# Patient Record
Sex: Female | Born: 1940
Health system: Southern US, Community
[De-identification: ages and names within clinical notes are randomized; demographics above are authoritative.]

## PROBLEM LIST (undated history)

## (undated) DIAGNOSIS — R519 Headache, unspecified: Secondary | ICD-10-CM

## (undated) DIAGNOSIS — K219 Gastro-esophageal reflux disease without esophagitis: Secondary | ICD-10-CM

## (undated) DIAGNOSIS — K5909 Other constipation: Secondary | ICD-10-CM

## (undated) DIAGNOSIS — R42 Dizziness and giddiness: Secondary | ICD-10-CM

## (undated) DIAGNOSIS — H269 Unspecified cataract: Secondary | ICD-10-CM

## (undated) DIAGNOSIS — R51 Headache: Secondary | ICD-10-CM

## (undated) DIAGNOSIS — Q438 Other specified congenital malformations of intestine: Secondary | ICD-10-CM

## (undated) HISTORY — DX: Other constipation: K59.09

## (undated) HISTORY — PX: BREAST SURGERY: SHX581

## (undated) HISTORY — PX: TOTAL ABDOMINAL HYSTERECTOMY W/ BILATERAL SALPINGOOPHORECTOMY: SHX83

## (undated) HISTORY — DX: Headache: R51

## (undated) HISTORY — PX: APPENDECTOMY: SHX54

## (undated) HISTORY — DX: Headache, unspecified: R51.9

## (undated) HISTORY — DX: Dizziness and giddiness: R42

## (undated) HISTORY — DX: Unspecified cataract: H26.9

## (undated) HISTORY — DX: Gastro-esophageal reflux disease without esophagitis: K21.9

## (undated) HISTORY — DX: Other specified congenital malformations of intestine: Q43.8

---

## 2006-03-20 HISTORY — PX: ESOPHAGEAL DILATION: SHX303

## 2007-01-07 ENCOUNTER — Encounter: Payer: Self-pay | Admitting: Family Medicine

## 2008-10-27 ENCOUNTER — Ambulatory Visit: Payer: Self-pay | Admitting: Diagnostic Radiology

## 2008-10-27 ENCOUNTER — Ambulatory Visit: Payer: Self-pay | Admitting: Family Medicine

## 2008-10-27 ENCOUNTER — Ambulatory Visit (HOSPITAL_BASED_OUTPATIENT_CLINIC_OR_DEPARTMENT_OTHER): Admission: RE | Admit: 2008-10-27 | Discharge: 2008-10-27 | Payer: Self-pay | Admitting: Family Medicine

## 2008-10-27 DIAGNOSIS — R1013 Epigastric pain: Secondary | ICD-10-CM

## 2008-10-27 DIAGNOSIS — K5900 Constipation, unspecified: Secondary | ICD-10-CM | POA: Insufficient documentation

## 2008-10-27 DIAGNOSIS — K3189 Other diseases of stomach and duodenum: Secondary | ICD-10-CM | POA: Insufficient documentation

## 2008-11-10 ENCOUNTER — Encounter: Payer: Self-pay | Admitting: Family Medicine

## 2008-11-12 LAB — CONVERTED CEMR LAB
Albumin: 4.2 g/dL (ref 3.5–5.2)
BUN: 12 mg/dL (ref 6–23)
Cholesterol: 189 mg/dL (ref 0–200)
HDL: 59 mg/dL (ref >39)
Potassium: 4.4 meq/L (ref 3.5–5.3)
Sodium: 140 meq/L (ref 135–145)
Total Protein: 7 g/dL (ref 6.0–8.3)

## 2008-11-24 ENCOUNTER — Ambulatory Visit: Payer: Self-pay | Admitting: Family Medicine

## 2008-11-25 ENCOUNTER — Encounter: Payer: Self-pay | Admitting: Family Medicine

## 2008-11-25 DIAGNOSIS — K209 Esophagitis, unspecified without bleeding: Secondary | ICD-10-CM | POA: Insufficient documentation

## 2008-11-25 DIAGNOSIS — M810 Age-related osteoporosis without current pathological fracture: Secondary | ICD-10-CM | POA: Insufficient documentation

## 2008-11-25 DIAGNOSIS — K222 Esophageal obstruction: Secondary | ICD-10-CM | POA: Insufficient documentation

## 2008-11-25 DIAGNOSIS — K2900 Acute gastritis without bleeding: Secondary | ICD-10-CM | POA: Insufficient documentation

## 2008-12-15 ENCOUNTER — Ambulatory Visit: Payer: Self-pay | Admitting: Family Medicine

## 2009-02-03 ENCOUNTER — Encounter: Payer: Self-pay | Admitting: Family Medicine

## 2009-03-31 ENCOUNTER — Encounter: Admission: RE | Admit: 2009-03-31 | Discharge: 2009-03-31 | Payer: Self-pay | Admitting: Gastroenterology

## 2009-04-02 ENCOUNTER — Encounter: Admission: RE | Admit: 2009-04-02 | Discharge: 2009-04-02 | Payer: Self-pay | Admitting: Gastroenterology

## 2009-04-07 ENCOUNTER — Encounter: Admission: RE | Admit: 2009-04-07 | Discharge: 2009-04-07 | Payer: Self-pay | Admitting: Gastroenterology

## 2009-04-16 ENCOUNTER — Encounter: Payer: Self-pay | Admitting: Family Medicine

## 2009-06-27 ENCOUNTER — Emergency Department (HOSPITAL_COMMUNITY): Admission: EM | Admit: 2009-06-27 | Discharge: 2009-06-27 | Payer: Self-pay | Admitting: Emergency Medicine

## 2009-07-01 ENCOUNTER — Telehealth (INDEPENDENT_AMBULATORY_CARE_PROVIDER_SITE_OTHER): Payer: Self-pay | Admitting: *Deleted

## 2009-07-13 ENCOUNTER — Emergency Department (HOSPITAL_COMMUNITY): Admission: EM | Admit: 2009-07-13 | Discharge: 2009-07-13 | Payer: Self-pay | Admitting: Emergency Medicine

## 2009-07-22 ENCOUNTER — Ambulatory Visit: Payer: Self-pay | Admitting: Family Medicine

## 2009-07-22 DIAGNOSIS — R079 Chest pain, unspecified: Secondary | ICD-10-CM | POA: Insufficient documentation

## 2009-07-27 ENCOUNTER — Encounter: Payer: Self-pay | Admitting: Family Medicine

## 2009-07-29 ENCOUNTER — Encounter: Payer: Self-pay | Admitting: Family Medicine

## 2009-08-04 ENCOUNTER — Encounter: Admission: RE | Admit: 2009-08-04 | Discharge: 2009-08-04 | Payer: Self-pay | Admitting: Gastroenterology

## 2009-08-12 ENCOUNTER — Encounter: Payer: Self-pay | Admitting: Family Medicine

## 2009-08-30 ENCOUNTER — Encounter: Payer: Self-pay | Admitting: Family Medicine

## 2009-11-29 ENCOUNTER — Ambulatory Visit: Payer: Self-pay | Admitting: Family Medicine

## 2009-11-29 ENCOUNTER — Telehealth (INDEPENDENT_AMBULATORY_CARE_PROVIDER_SITE_OTHER): Payer: Self-pay | Admitting: *Deleted

## 2009-11-29 DIAGNOSIS — R109 Unspecified abdominal pain: Secondary | ICD-10-CM | POA: Insufficient documentation

## 2009-11-29 DIAGNOSIS — H9190 Unspecified hearing loss, unspecified ear: Secondary | ICD-10-CM | POA: Insufficient documentation

## 2009-11-29 DIAGNOSIS — R030 Elevated blood-pressure reading, without diagnosis of hypertension: Secondary | ICD-10-CM | POA: Insufficient documentation

## 2009-11-29 LAB — CONVERTED CEMR LAB
ALT: 18 units/L (ref 0–35)
AST: 30 units/L (ref 0–37)
Albumin: 3.8 g/dL (ref 3.5–5.2)
Alkaline Phosphatase: 83 units/L (ref 39–117)
Basophils Absolute: 0 10*3/uL (ref 0.0–0.1)
Calcium: 9.2 mg/dL (ref 8.4–10.5)
Chloride: 107 meq/L (ref 96–112)
Creatinine, Ser: 1 mg/dL (ref 0.4–1.2)
GFR calc non Af Amer: 73.42 mL/min (ref >60)
Glucose, Bld: 87 mg/dL (ref 70–99)
HCT: 40.5 % (ref 36.0–46.0)
Hemoglobin: 13.9 g/dL (ref 12.0–15.0)
MCHC: 34.4 g/dL (ref 30.0–36.0)
MCV: 88.4 fL (ref 78.0–100.0)
Neutro Abs: 2.4 10*3/uL (ref 1.4–7.7)
Neutrophils Relative %: 47.8 % (ref 43.0–77.0)
Platelets: 246 10*3/uL (ref 150.0–400.0)
Potassium: 4.8 meq/L (ref 3.5–5.1)
RDW: 13.3 % (ref 11.5–14.6)
Sodium: 143 meq/L (ref 135–145)
Total Protein: 6.5 g/dL (ref 6.0–8.3)
WBC: 5.1 10*3/uL (ref 4.5–10.5)

## 2009-12-20 ENCOUNTER — Encounter: Payer: Self-pay | Admitting: Family Medicine

## 2009-12-21 ENCOUNTER — Ambulatory Visit: Payer: Self-pay | Admitting: Family Medicine

## 2009-12-21 DIAGNOSIS — R143 Flatulence: Secondary | ICD-10-CM

## 2009-12-21 DIAGNOSIS — R142 Eructation: Secondary | ICD-10-CM

## 2009-12-21 DIAGNOSIS — R141 Gas pain: Secondary | ICD-10-CM | POA: Insufficient documentation

## 2009-12-22 LAB — CONVERTED CEMR LAB
Cholesterol: 181 mg/dL (ref 0–200)
H Pylori IgG: NEGATIVE
Triglycerides: 46 mg/dL (ref 0.0–149.0)
VLDL: 9.2 mg/dL (ref 0.0–40.0)

## 2010-01-05 ENCOUNTER — Ambulatory Visit: Payer: Self-pay | Admitting: Family Medicine

## 2010-01-05 DIAGNOSIS — D492 Neoplasm of unspecified behavior of bone, soft tissue, and skin: Secondary | ICD-10-CM | POA: Insufficient documentation

## 2010-01-05 DIAGNOSIS — R300 Dysuria: Secondary | ICD-10-CM | POA: Insufficient documentation

## 2010-01-05 LAB — CONVERTED CEMR LAB
Bilirubin Urine: NEGATIVE
Blood in Urine, dipstick: NEGATIVE
Glucose, Urine, Semiquant: NEGATIVE
Nitrite: NEGATIVE
Protein, U semiquant: NEGATIVE

## 2010-01-06 ENCOUNTER — Encounter: Payer: Self-pay | Admitting: Family Medicine

## 2010-01-07 ENCOUNTER — Telehealth (INDEPENDENT_AMBULATORY_CARE_PROVIDER_SITE_OTHER): Payer: Self-pay | Admitting: *Deleted

## 2010-01-10 ENCOUNTER — Encounter: Admission: RE | Admit: 2010-01-10 | Discharge: 2010-01-10 | Payer: Self-pay | Admitting: Family Medicine

## 2010-01-12 ENCOUNTER — Telehealth (INDEPENDENT_AMBULATORY_CARE_PROVIDER_SITE_OTHER): Payer: Self-pay | Admitting: *Deleted

## 2010-02-07 ENCOUNTER — Ambulatory Visit: Payer: Self-pay | Admitting: Family Medicine

## 2010-02-07 DIAGNOSIS — M5442 Lumbago with sciatica, left side: Secondary | ICD-10-CM | POA: Insufficient documentation

## 2010-02-07 DIAGNOSIS — R519 Headache, unspecified: Secondary | ICD-10-CM | POA: Insufficient documentation

## 2010-02-07 DIAGNOSIS — R51 Headache: Secondary | ICD-10-CM | POA: Insufficient documentation

## 2010-02-07 LAB — CONVERTED CEMR LAB
Bilirubin Urine: NEGATIVE
Ketones, urine, test strip: NEGATIVE
Protein, U semiquant: NEGATIVE
Specific Gravity, Urine: 1.015
Urobilinogen, UA: 0.2

## 2010-02-24 ENCOUNTER — Encounter: Payer: Self-pay | Admitting: Family Medicine

## 2010-03-10 ENCOUNTER — Telehealth: Payer: Self-pay | Admitting: Family Medicine

## 2010-04-07 ENCOUNTER — Ambulatory Visit
Admission: RE | Admit: 2010-04-07 | Discharge: 2010-04-07 | Payer: Self-pay | Source: Home / Self Care | Attending: Family Medicine | Admitting: Family Medicine

## 2010-04-07 ENCOUNTER — Other Ambulatory Visit: Payer: Self-pay | Admitting: Family Medicine

## 2010-04-07 DIAGNOSIS — H5310 Unspecified subjective visual disturbances: Secondary | ICD-10-CM | POA: Insufficient documentation

## 2010-04-07 LAB — HIGH SENSITIVITY CRP: CRP, High Sensitivity: 0.91 mg/L (ref 0.00–5.00)

## 2010-04-07 LAB — BUN: BUN: 10 mg/dL (ref 6–23)

## 2010-04-07 LAB — CREATININE, SERUM: Creatinine, Ser: 0.9 mg/dL (ref 0.4–1.2)

## 2010-04-07 LAB — SEDIMENTATION RATE: Sed Rate: 17 mm/hr (ref 0–22)

## 2010-04-12 ENCOUNTER — Telehealth: Payer: Self-pay | Admitting: Family Medicine

## 2010-04-19 NOTE — Consult Note (Signed)
Summary: [facility]  [facility]   Imported By: Lanelle Bal 10/22/2009 10:07:40  _____________________________________________________________________  External Attachment:    Type:   Image     Comment:   External Document

## 2010-04-19 NOTE — Assessment & Plan Note (Signed)
Summary: followup on headaches and hearing/kn   Vital Signs:  Patient profile:   70 year old female Weight:      160 pounds Pulse rate:   76 / minute BP sitting:   132 / 88  (left arm)  Vitals Entered By: Doristine Devoid CMA (February 07, 2010 11:21 AM) CC: F/U still w/ headaches, back pain and dysuria    History of Present Illness: 70 yo woman here today for f/u on  1) HA- persisting on L side of head, from top of head down into neck.  'nagging pain'.  denies nausea.  mild photo/phonophobia.  sxs are intermittant.  current HA has been present since Thursday.  pain improves if pt remains upright- worsens w/ lying down.  + sinus pressure on L.  no fevers.  denies focal weakness.  2) dysuria- 'feels like i need to urinate but when i sit down, there's nothing'.  will have 'sharp pains in urinary tract when walking'.  denies stress incontinence.  no hematuria.  no improvement w/ abx after last visit.  sxs occuring daily.  has never seen a urologist.  pt reports she has to 'wait for it to start' w/ urination and then 'it'll cut off.  i keep waiting and then it will start again'.  3) back pain- now pain is neck and upper back.  'it's so tight'.  pain will travel from back of head down to between shoulder blades.  no problems w/ motion.  no weakness of arms or legs.  Current Medications (verified): 1)  Aciphex 20 Mg Tbec (Rabeprazole Sodium) .... Take 1 Tab Every Morning  Allergies (verified): No Known Drug Allergies  Past History:  Past medical, surgical, family and social histories (including risk factors) reviewed, and no changes noted (except as noted below).  Past Medical History: Reviewed history from 07/22/2009 and no changes required. [id number] colonoscopy in 09 Dr Noe Gens [facility] -- tortuous colon chronic constipation esophagitis Dr Ronney Asters is GI- follows regularly  Past Surgical History: Reviewed history from 11/25/2008 and no changes required. 2 benign breast tumor removed,  benign TAH with BSO for fibroids and ovarian cysts Appy 2008 esophageal dilation  Family History: Reviewed history from 07/22/2009 and no changes required. Father drowned Mother died from a PE 2 brothers died - Unknown at 62, mesothelioma. 3 sisters living - HTN. no colon cancer, breast cancer MGM- CVA  Social History: Reviewed history from 10/27/2008 and no changes required. 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 Widowed x 13 yrs. Lives alone. Never smoked.  No ETOH. No relationships.  Goes to church. Enjoys aerobics.  Review of Systems      See HPI  Physical Exam  General:  alert, well-developed, well-nourished, and well-hydrated.   Head:  NCAT, no TTP over sinuses Eyes:  sclera non icteric, PERRL, EOMI, fundi WNL Ears:  EACs patent; TMs translucent and gray with good cone of light and bony landmarks.  Nose:  no nasal discharge.   Mouth:  MMM Neck:  no masses.  + trap spasm bilaterally Lungs:  Normal respiratory effort, chest expands symmetrically. Lungs are clear to auscultation, no crackles or wheezes. Heart:  Normal rate and regular rhythm. S1 and S2 normal without gallop, murmur, click, rub or other extra sounds. Abdomen:  soft, mild suprapubic tenderness w/ palpation.  no CVA tenderness Msk:  + trapezius spasm bilaterally paraspinal thoracic pain good flexion and extension of neck and back Pulses:  +2 carotid, radial, DP Extremities:  no C/C/E Neurologic:  strength normal in  all extremities, sensation intact to light touch, gait normal, and DTRs symmetrical and normal.     Impression & Recommendations:  Problem # 1:  HEADACHE (ICD-784.0) Assessment Unchanged pt would like to see neurology to 'understand' her HAs.  'i know i can treat them but i want to know why i have them'.  no red flags on hx or PE.  will refer. The following medications were removed from the medication list:    Naprosyn 500 Mg Tabs (Naproxen) .Marland Kitchen... 1 two times a day x7-10 days and then as needed  for pain.  take w/ food.  Orders: Neurology Referral (Neuro)  Problem # 2:  DYSURIA (ICD-788.1) Assessment: Unchanged no obvious infxn on UA.  refer to urology for persistant discomfort Orders: UA Dipstick w/o Micro (manual) (78469) Urology Referral (Urology)  Problem # 3:  BACK PAIN (ICD-724.5) Assessment: New  + trap and paraspinal spasm- likely contributing to HA.  start muscle relaxer as needed. The following medications were removed from the medication list:    Naprosyn 500 Mg Tabs (Naproxen) .Marland Kitchen... 1 two times a day x7-10 days and then as needed for pain.  take w/ food. Her updated medication list for this problem includes:    Cyclobenzaprine Hcl 10 Mg Tabs (Cyclobenzaprine hcl) .Marland Kitchen... 1 by mouth 2 times daily as needed for back pain.  will cause drowsiness  Orders: Prescription Created Electronically [phone])  Complete Medication List: 1)  Aciphex 20 Mg Tbec (Rabeprazole sodium) .... Take 1 tab every morning 2)  Cyclobenzaprine Hcl 10 Mg Tabs (Cyclobenzaprine hcl) .Marland Kitchen.. 1 by mouth 2 times daily as needed for back pain.  will cause drowsiness  Patient Instructions: 1)  Someone will call you with your urology and neurology appts 2)  Take the Cyclobenzaprine (muscle relaxer) for your neck and shoulder spasm 3)  Use a heating pad for pain relief 4)  Call with any questions or concerns 5)  Hang in there!!! Prescriptions: CYCLOBENZAPRINE HCL 10 MG  TABS (CYCLOBENZAPRINE HCL) 1 by mouth 2 times daily as needed for back pain.  will cause drowsiness  #20 x 0   Entered and Authorized by:   Neena Rhymes MD   Signed by:   Neena Rhymes MD on 02/07/2010   Method used:   Electronically to        [facility]* (retail)       [address] [place], [place]  [address]       Ph: [phone]       Fax: [phone]   RxID:   [id number]    Orders Added: 1)  UA Dipstick w/o Micro (manual) [81002] 2)  Neurology Referral [Neuro] 3)  Urology  Referral [Urology] 4)  Est. Patient Level IV [88416] 5)  Prescription Created Electronically [id number]    Laboratory Results   Urine Tests    Routine Urinalysis   Glucose: negative   (Normal Range: Negative) Bilirubin: negative   (Normal Range: Negative) Ketone: negative   (Normal Range: Negative) Spec. Gravity: 1.015   (Normal Range: 1.003-1.035) Blood: negative   (Normal Range: Negative) pH: 6.0   (Normal Range: 5.0-8.0) Protein: negative   (Normal Range: Negative) Urobilinogen: 0.2   (Normal Range: 0-1) Nitrite: negative   (Normal Range: Negative) Leukocyte Esterace: negative   (Normal Range: Negative)

## 2010-04-19 NOTE — Letter (Signed)
Summary: [facility]  [facility]   Imported By: Lanelle Bal 08/09/2009 10:10:25  _____________________________________________________________________  External Attachment:    Type:   Image     Comment:   External Document

## 2010-04-19 NOTE — Progress Notes (Signed)
Summary: ultrasound results  Phone Note Outgoing Call   Call placed by: Doristine Devoid CMA,  January 12, 2010 12:01 PM Call placed to: Patient Summary of Call: normal [place]- please refer to ortho if pt still having sxs  Follow-up for Phone Call        Patient is aware and does not need referral at this point.Harold Barban  January 13, 2010 11:01 AM

## 2010-04-19 NOTE — Assessment & Plan Note (Signed)
Summary: FOLLOWUP///SP[facility]   Vital Signs:  Patient profile:   70 year old female Height:      63 inches Weight:      158 pounds BMI:     28.09 Pulse rate:   69 / minute BP sitting:   122 / 82  (left arm)  Vitals Entered By: Doristine Devoid CMA (December 21, 2009 9:36 AM) CC: f/u on diarrhea- which has resolved but now w/ bloating    History of Present Illness: 70 yo woman here today for f/u on recent diarrheal illness.  pt reports 'much better'.  bowels 'back on track'.  now complaining of bloating w/ eating.  previously on Aciphex but doesn't have prescription coverage so can't afford them and ran out 1 week ago.  bloating corresponds to stopping the aciphex.  no N/V/D.  denies abd pain.  Current Medications (verified): 1)  Promethazine Hcl 25 Mg  Tabs (Promethazine Hcl) .Marland Kitchen.. 1 Tab By Mouth Q6 As Needed For Nausea.  May Cause Drowsiness. 2)  Aciphex 20 Mg Tbec (Rabeprazole Sodium) .... Take 1 Tab Every Morning  Allergies (verified): No Known Drug Allergies  Past History:  Past Medical History: Last updated: 07/22/2009 G2P1011 colonoscopy in 09 Dr Noe Gens [facility] -- tortuous colon chronic constipation esophagitis Dr Ronney Asters is GI- follows regularly  Past Surgical History: Last updated: 11/25/2008 2 benign breast tumor removed, benign TAH with BSO for fibroids and ovarian cysts Appy 2008 esophageal dilation  Review of Systems      See HPI  Physical Exam  General:  alert, well-developed, well-nourished, and well-hydrated.   Lungs:  Normal respiratory effort, chest expands symmetrically. Lungs are clear to auscultation, no crackles or wheezes. Heart:  Normal rate and regular rhythm. S1 and S2 normal without gallop, murmur, click, rub or other extra sounds. Abdomen:  soft, NT/ND, +BS   Impression & Recommendations:  Problem # 1:  GAS/BLOATING (ICD-787.3) Assessment New pt's sxs correspond w/ stopping Aciphex.  will check H pylori to r/o PUD.  samples of Aciphex given.  no  red flags on hx or PE.  reviewed supportive care and red flags that should prompt return.  Pt expresses understanding and is in agreement w/ this plan. Orders: Venipuncture (16109) Specimen Handling (60454) TLB-H. Pylori Abs(Helicobacter Pylori) (86677-HELICO)  Problem # 2:  PHYSICAL EXAMINATION (ICD-V70.0) Assessment: New labs collected for upcoming visit. Orders: Specimen Handling (09811) TLB-Lipid Panel (80061-LIPID) TLB-TSH (Thyroid Stimulating Hormone) (84443-TSH)  Complete Medication List: 1)  Promethazine Hcl 25 Mg Tabs (Promethazine hcl) .Marland Kitchen.. 1 tab by mouth q6 as needed for nausea.  may cause drowsiness. 2)  Aciphex 20 Mg Tbec (Rabeprazole sodium) .... Take 1 tab every morning  Other Orders: T-Vitamin D (25-Hydroxy) [id number])  Patient Instructions: 1)  Please schedule your physical at your convenience- you can eat before this appt 2)  Start the Aciphex daily 3)  We'll notify you of your lab results 4)  Call with any questions or concerns 5)  Hang in there!! Prescriptions: ACIPHEX 20 MG TBEC (RABEPRAZOLE SODIUM) Take 1 tab every morning  #30 x 0   Entered and Authorized by:   Neena Rhymes MD   Signed by:   Neena Rhymes MD on 12/21/2009   Method used:   Print then Give to Patient   RxID:   [id number]

## 2010-04-19 NOTE — Progress Notes (Signed)
Summary: urine cx  Phone Note Outgoing Call   Call placed by: Doristine Devoid CMA,  January 07, 2010 4:18 PM Call placed to: Patient Summary of Call: no obvious infxn- please call pt and ask how her sxs are.   Follow-up for Phone Call        spoke w/ patient says that her symptoms are mild but she has been drinking plenty of water and cranberry juice and will call if no improvement........Marland KitchenDoristine Devoid CMA  January 07, 2010 4:19 PM

## 2010-04-19 NOTE — Therapy (Signed)
Summary: [facility]  [facility]   Imported By: Lanelle Bal 12/29/2009 11:23:03  _____________________________________________________________________  External Attachment:    Type:   Image     Comment:   External Document

## 2010-04-19 NOTE — Progress Notes (Signed)
Summary: lab results  Phone Note Outgoing Call   Call placed by: [facility] CMA,  November 29, 2009 5:05 PM Details for Reason: electrolytes are all normal.  no evidence of bacterial infection.  pt's diarrhea should likely resolve on it's own.  continue to drink plenty of fluids and follow up if no better in the next week or so. Summary of Call: left message to call office .......................................Marland KitchenFelecia Deloach CMA  November 29, 2009 5:05 PM  Discuss with patient .....................Marland KitchenFelecia Deloach CMA  November 30, 2009 1:42 PM

## 2010-04-19 NOTE — Letter (Signed)
[facility]: [facility]   Imported By: Lanelle Bal 04/26/2009 11:31:19  _____________________________________________________________________  External Attachment:    Type:   Image     Comment:   External Document

## 2010-04-19 NOTE — Letter (Signed)
Summary: [facility]   Imported By: Lanelle Bal 08/03/2009 12:22:38  _____________________________________________________________________  External Attachment:    Type:   Image     Comment:   External Document

## 2010-04-19 NOTE — Assessment & Plan Note (Signed)
Summary: NEW PT/MEDICARE/BCBS/NS/KDC   Vital Signs:  Patient profile:   70 year old female Height:      63 inches Weight:      160 pounds BMI:     28.45 Pulse rate:   72 / minute BP sitting:   130 / 78  (left arm)  Vitals Entered By: Doristine Devoid 2009-08-15 10:03 AM) CC: NEW EST- yearly exam w/ pap   History of Present Illness: Here to establish care.  Previous MD- [facility].  Osteoporosis- Taking Ca+ Vit D, due for dexa.  exercising 3x/week at the gym- cardio and weights  CP- went to hospital last week for chest pain.  had intermittant substernal chest pain.  labs were normal, EKG normal, CXR normal.  pt feels it was a 'severe case of indigestion'.  has appt w/ [facility] on 5/12.  hx of gastritis- pt feels CP was 'severe case of indigestion'.  following w/ Dr Ronney Asters.  has stopped PPI.  overwt- pt is going to the gym at least 3x/week.  doing cardio on the treadmill, elliptical and using wt machines.  not following special diet.  Preventive Screening-Counseling & Management  Alcohol-Tobacco     Alcohol drinks/day: 0     Smoking Status: never  Caffeine-Diet-Exercise     Does Patient Exercise: yes     Type of exercise: goes to the gyn     Times/week: 3      Sexual History:  widowed.        Drug Use:  never.    Current Medications (verified): 1)  None  Allergies (verified): No Known Drug Allergies  Past History:  Past Surgical History: Last updated: 11/25/2008 2 benign breast tumor removed, benign TAH with BSO for fibroids and ovarian cysts Appy 2008 esophageal dilation  Family History: Last updated: 2009/08/15 Father drowned Mother died from a PE 2 brothers died - Unknown at 57, mesothelioma. 3 sisters living - HTN. no colon cancer, breast cancer MGM- CVA  Social History: Last updated: 10/27/2008 Retired - [employer]. Widowed x 13 yrs. Lives alone. Never smoked.  No ETOH. No relationships.  Goes to church. Enjoys aerobics.  Past Medical  History: G2P1011 colonoscopy in 09 Dr Noe Gens [facility] -- tortuous colon chronic constipation esophagitis Dr Ronney Asters is GI- follows regularly  Family History: Father drowned Mother died from a PE 2 brothers died - Unknown at 39, mesothelioma. 3 sisters living - HTN. no colon cancer, breast cancer MGM- CVA  Social History: Reviewed history from 10/27/2008 and no changes required. 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 Widowed x 13 yrs. Lives alone. Never smoked.  No ETOH. No relationships.  Goes to church. Enjoys aerobics.Smoking Status:  never Does Patient Exercise:  yes Sexual History:  widowed Drug Use:  never  Review of Systems General:  Denies chills, fatigue, fever, malaise, and sweats. CV:  Denies chest pain or discomfort, fainting, lightheadness, near fainting, palpitations, and shortness of breath with exertion. Resp:  Denies shortness of breath. GI:  Complains of constipation, indigestion, and nausea; denies vomiting; nausea w/ eating- has appt w/ GI. MS:  Denies joint pain and muscle weakness. Neuro:  Complains of memory loss; denies headaches. Psych:  Denies anxiety and depression.  Physical Exam  General:  alert, well-developed, well-nourished, and well-hydrated.   Head:  normocephalic and atraumatic.   Neck:  no masses.   Lungs:  Normal respiratory effort, chest expands symmetrically. Lungs are clear to auscultation, no crackles or wheezes. Heart:  Normal rate and  regular rhythm. S1 and S2 normal without gallop, murmur, click, rub or other extra sounds. Abdomen:  soft, NT/ND, +BS Pulses:  2+ radial and pedal pulses Extremities:  trace bilat ankle edema Skin:  color normal.   Psych:  good eye contact, not anxious appearing, and not depressed appearing.     Impression & Recommendations:  Problem # 1:  SENILE OSTEOPOROSIS (ICD-733.01) Assessment Unchanged overdue for bone density.  will refer for dexa and mammo in June so pt can have both at same time  Problem # 2:   CHEST PAIN (ICD-786.50) Assessment: New pt has upcoming appt w/ cardiology.  recent ER w/u negative.  denies ongoing or current CP.  as pt feels this was 'bad case of indigestion' gave samples of Prilosec OTC.  will follow.  Problem # 3:  OVERWEIGHT (ICD-278.02) Assessment: New pt mildly overwt but very healthy lifestyle.  will trend.  Problem # 4:  ACUTE GASTRITIS WITHOUT MENTION OF HEMORRHAGE (ICD-535.00) Assessment: Unchanged stopped PPI b/c 'things were getting better'.  recent episode of CP has made pt think it is again GER.  has upcoming f/u w/ Dr Ronney Asters.  samples of Prilosec OTC given.  will follow. The following medications were removed from the medication list:    Omeprazole 40 Mg Cpdr (Omeprazole) .Marland Kitchen... 1 tab by mouth bid  Patient Instructions: 1)  Please schedule your physical for sometime after Sept 7th- do not eat before this appt 2)  Someone will call you with your mammogram and bone density appts 3)  Please have Dr Matthias Hughs and your cardiologist send me copies of their notes 4)  Keep up the great work on diet and exercise- you look wonderful! 5)  Call with any questions or concerns 6)  Welcome!  We're glad to have you! 7)  Have a great summer!

## 2010-04-19 NOTE — Assessment & Plan Note (Signed)
Summary: uti//lch   Vital Signs:  Patient profile:   70 year old female Weight:      161 pounds Temp:     98.7 degrees F oral BP sitting:   130 / 82  (left arm)  Vitals Entered By: Doristine Devoid CMA (January 05, 2010 4:26 PM) CC: UTI sx x2 weeks some urgency and dysuria   History of Present Illness: 70 yo woman here today for ? UTI.  + dysuria x2 weeks.  + frequency, hesitancy, urgency.  no blood.  hx of bladder infxns.  feels like previous.  low back pain- radiating down both legs.  having numbness of lateral 3 toes bilaterally.  'there's a lump there'.  1st noticed 1 week ago.  pain w/ lying flat and walking.  less pain w/ sitting.  no weakness of legs.  has nausea associated w/ intense pain.  pain w/ extension> flexion.  denies bowel or bladder incontinence.  Current Medications (verified): 1)  Promethazine Hcl 25 Mg  Tabs (Promethazine Hcl) .Marland Kitchen.. 1 Tab By Mouth Q6 As Needed For Nausea.  May Cause Drowsiness. 2)  Aciphex 20 Mg Tbec (Rabeprazole Sodium) .... Take 1 Tab Every Morning 3)  Cephalexin 500 Mg  Tabs (Cephalexin) .... Take One By Mouth 2 Times Daily X5 Days 4)  Naprosyn 500 Mg Tabs (Naproxen) .Marland Kitchen.. 1 Two Times A Day X7-10 Days and Then As Needed For Pain.  Take W/ Food.  Allergies (verified): No Known Drug Allergies  Past History:  Past medical, surgical, family and social histories (including risk factors) reviewed, and no changes noted (except as noted below).  Past Medical History: Reviewed history from 07/22/2009 and no changes required. [id number] colonoscopy in 09 Dr Noe Gens [facility] -- tortuous colon chronic constipation esophagitis Dr Ronney Asters is GI- follows regularly  Past Surgical History: Reviewed history from 11/25/2008 and no changes required. 2 benign breast tumor removed, benign TAH with BSO for fibroids and ovarian cysts Appy 2008 esophageal dilation  Family History: Reviewed history from 07/22/2009 and no changes required. Father drowned Mother died  from a PE 2 brothers died - Unknown at 52, mesothelioma. 3 sisters living - HTN. no colon cancer, breast cancer MGM- CVA  Social History: Reviewed history from 10/27/2008 and no changes required. 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 Widowed x 13 yrs. Lives alone. Never smoked.  No ETOH. No relationships.  Goes to church. Enjoys aerobics.  Review of Systems      See HPI  Physical Exam  General:  alert, well-developed, well-nourished, and well-hydrated.   Lungs:  Normal respiratory effort, chest expands symmetrically. Lungs are clear to auscultation, no crackles or wheezes. Heart:  Normal rate and regular rhythm. S1 and S2 normal without gallop, murmur, click, rub or other extra sounds. Abdomen:  soft, mild suprapubic tenderness w/ palpation.  no CVA tenderness Msk:   ~1 cm diameter soft tissue mass just left of spine.  TTP.  causes radicular sxs when pressed.  + SLR bilaterally Pulses:  +2 carotid, radial, DP Extremities:  no C/C/E Neurologic:  strength normal in all extremities, sensation intact to light touch, gait normal, and DTRs symmetrical and normal.     Impression & Recommendations:  Problem # 1:  DYSURIA (ICD-788.1) Assessment Unchanged pt w/ apparent UTI.  start Keflex.  send urine for cx.  adjust abx as needed. Her updated medication list for this problem includes:    Cephalexin 500 Mg Tabs (Cephalexin) .Marland Kitchen... Take one by mouth 2 times daily x5 days  Orders: UA  Dipstick w/o Micro (manual) (10272) Specimen Handling (99000) T-Culture, Urine ([id number]) Prescription Created Electronically [id number])  Problem # 2:  NEOPLASMS UNSPEC NATURE BONE SOFT TISSUE&SKIN (ICD-239.2) Assessment: New pt w/ palpable soft tissue mass just lateral to spine causing radicular sxs.  start w/ [place] to see if mass can be characterized.  start NSAIDs for pain relief.  may need ortho or neurosurg referral if sxs don't improve.  will follow. Orders: Radiology Referral (Radiology)  Complete  Medication List: 1)  Promethazine Hcl 25 Mg Tabs (Promethazine hcl) .Marland Kitchen.. 1 tab by mouth q6 as needed for nausea.  may cause drowsiness. 2)  Aciphex 20 Mg Tbec (Rabeprazole sodium) .... Take 1 tab every morning 3)  Cephalexin 500 Mg Tabs (Cephalexin) .... Take one by mouth 2 times daily x5 days 4)  Naprosyn 500 Mg Tabs (Naproxen) .Marland Kitchen.. 1 two times a day x7-10 days and then as needed for pain.  take w/ food.  Patient Instructions: 1)  Someone will call you with your appts for your back 2)  Take the Cephalexin for the bladder infection 3)  Drink plenty of fluids 4)  Take the Naprosyn for the back pain and inflammation- you can add tylenol as needed. 5)  If the pain worsens please call or go to the ER if it is severe 6)  Use a heating pad for your back pain 7)  Call with any questions or concerns 8)  Hang in there!!! Prescriptions: NAPROSYN 500 MG TABS (NAPROXEN) 1 two times a day x7-10 days and then as needed for pain.  take w/ food.  #60 x 0   Entered and Authorized by:   Neena Rhymes MD   Signed by:   Neena Rhymes MD on 01/05/2010   Method used:   Electronically to        [facility]* (retail)       [address] [place], [place]  [address]       Ph: [phone]       Fax: [phone]   RxID:   [id number] CEPHALEXIN 500 MG  TABS (CEPHALEXIN) take one by mouth 2 times daily x5 days  #10 x 0   Entered and Authorized by:   Neena Rhymes MD   Signed by:   Neena Rhymes MD on 01/05/2010   Method used:   Electronically to        [facility]* (retail)       [address] [place], [place]  [address]       Ph: [phone]       Fax: [phone]   RxID:   [id number]    Orders Added: 1)  UA Dipstick w/o Micro (manual) [81002] 2)  Specimen Handling [99000] 3)  T-Culture, Urine [[id number]] 4)  Radiology Referral [Radiology] 5)  Est. Patient Level III [16967] 6)  Prescription Created  Electronically [phone]    Laboratory Results   Urine Tests    Routine Urinalysis   Glucose: negative   (Normal Range: Negative) Bilirubin: negative   (Normal Range: Negative) Ketone: large (80)   (Normal Range: Negative) Spec. Gravity: 1.015   (Normal Range: 1.003-1.035) Blood: negative   (Normal Range: Negative) pH: 6.0   (Normal Range: 5.0-8.0) Protein: negative   (Normal Range: Negative) Urobilinogen: 0.2   (Normal Range: 0-1) Nitrite: negative   (Normal Range: Negative) Leukocyte  Esterace: trace   (Normal Range: Negative)

## 2010-04-19 NOTE — Assessment & Plan Note (Signed)
Summary: NVD-food poisoning//KN   Vital Signs:  Patient profile:   70 year old female Height:      63 inches Weight:      161 pounds Temp:     98.1 degrees F oral Pulse rate:   68 / minute BP sitting:   150 / 88  (left arm)  Vitals Entered By: Jeremy Johann CMA (November 29, 2009 8:02 AM) CC: NVD, possible food posioning   History of Present Illness: 70 yo woman here today w/ ? food poisoning.  'i ate some rotten food last monday'- ate food from a prison vending machine while visiting grandson.  ate a moldy hamburger.  started diarrhea almost immediately- <2 hrs after eating.  no blood.  stools loose but not watery.  still eating.  loose stools 3x/day.  + nausea but no vomiting.  no fever.  + HA.  'i've been aggravated by this.  real worked up'- blood pressure mildly elevated today.  will have abd pain starting in R flank radiating to umbilicus, intermittant.  resolved currently.  + gas and bloating.  no known sick contacts.  L hearing loss- 2 yrs of sxs.  would like a referral for complete hearing test.  Current Medications (verified): 1)  Promethazine Hcl 25 Mg  Tabs (Promethazine Hcl) .Marland Kitchen.. 1 Tab By Mouth Q6 As Needed For Nausea.  May Cause Drowsiness. 2)  Aciphex 20 Mg Tbec (Rabeprazole Sodium) .... Take 1 Tab Every Morning  Allergies (verified): No Known Drug Allergies  Past History:  Past Medical History: Last updated: 07/22/2009 [id number] colonoscopy in 09 Dr Noe Gens [facility] -- tortuous colon chronic constipation esophagitis Dr Ronney Asters is GI- follows regularly  Past Surgical History: Last updated: 11/25/2008 2 benign breast tumor removed, benign TAH with BSO for fibroids and ovarian cysts Appy 2008 esophageal dilation  Review of Systems      See HPI  Physical Exam  General:  alert, well-developed, well-nourished, and well-hydrated.   Head:  NCAT Mouth:  MMM Lungs:  Normal respiratory effort, chest expands symmetrically. Lungs are clear to auscultation, no  crackles or wheezes. Heart:  Normal rate and regular rhythm. S1 and S2 normal without gallop, murmur, click, rub or other extra sounds. Abdomen:  soft, mild TTP in RUQ and epigastrum.  no rebound or guarding.  mildly distended Pulses:  +2 carotid, radial, DP Extremities:  no C/C/E   Impression & Recommendations:  Problem # 1:  DIARRHEA (ICD-787.91) Assessment New pt typically w/ constipation so 3 loose stools daily is excessive for pt.  will check CBC to r/o infxn, BMP to r/o dehydration.  no immodium as pt thinks this was food borne.  reviewed supportive care and red flags that should prompt return.  Pt expresses understanding and is in agreement w/ this plan. Orders: Venipuncture (16109) TLB-BMP (Basic Metabolic Panel-BMET) (80048-METABOL) TLB-CBC Platelet - w/Differential (85025-CBCD) Specimen Handling (60454)  Problem # 2:  ABDOMINAL PAIN (ICD-789.00) Assessment: New some RUQ pain.  not convincing for cholecystitis.  check labs to r/o biliary involvement or pancreatitis.  already on Aciphex.  reviewed supportive care and red flags that should prompt return.  Pt expresses understanding and is in agreement w/ this plan. Orders: TLB-Hepatic/Liver Function Pnl (80076-HEPATIC) TLB-Amylase (82150-AMYL) TLB-Lipase (83690-LIPASE) Specimen Handling (09811) Prescription Created Electronically [phone])  Problem # 3:  UNSPECIFIED HEARING LOSS (ICD-389.9) Assessment: New pt w/ sxs x2 yrs.  will refer for complete hearing eval. Orders: Audiology (Audio)  Problem # 4:  ELEVATED BP W/O HYPERTENSION (ICD-796.2) Assessment: New may  be due to pt's current illness and stress regarding her situation.  will recheck when she returns for her CPE.  Complete Medication List: 1)  Promethazine Hcl 25 Mg Tabs (Promethazine hcl) .Marland Kitchen.. 1 tab by mouth q6 as needed for nausea.  may cause drowsiness. 2)  Aciphex 20 Mg Tbec (Rabeprazole sodium) .... Take 1 tab every morning  Patient Instructions: 1)   Please schedule your physical once you're feeling better 2)  We'll notify you of your lab results 3)  Drink plenty of fluids- your appetite will come back when it's ready 4)  Take the promethazine as needed for nausea 5)  Call if your pain worsens or you have other concerns 6)  Someone will call you with your hearing appt 7)  Hang in there!!! Prescriptions: ACIPHEX 20 MG TBEC (RABEPRAZOLE SODIUM) Take 1 tab every morning  #90 x 3   Entered and Authorized by:   Neena Rhymes MD   Signed by:   Neena Rhymes MD on 11/29/2009   Method used:   Historical   RxID:   [id number] PROMETHAZINE HCL 25 MG  TABS (PROMETHAZINE HCL) 1 tab by mouth q6 as needed for nausea.  may cause drowsiness.  #20 x 0   Entered and Authorized by:   Neena Rhymes MD   Signed by:   Neena Rhymes MD on 11/29/2009   Method used:   Electronically to        [facility]* (retail)       [address] [place], [place]  [address]       Ph: [phone]       Fax: [phone]   RxID:   [id number]

## 2010-04-19 NOTE — Progress Notes (Signed)
Summary: Card referral   Phone Note Call from Patient   Caller: Patient Summary of Call: Pt would like referral to cardiologist. She stated she has seen cards in the past but doesn't think her doc is still practicing so she would like to see someone new. Please advise. Initial call taken by: Payton Spark CMA,  July 01, 2009 3:04 PM  Follow-up for Phone Call        I reviewed her chart and I don't see a diagnosis to support cardiology referral.  Pls check with pt to see why she wants referral.   Follow-up by: Seymour Bars DO,  July 01, 2009 3:26 PM  Additional Follow-up for Phone Call Additional follow up Details #1::        [facility] for Pt to CB Additional Follow-up by: Payton Spark CMA,  July 01, 2009 3:45 PM

## 2010-04-20 ENCOUNTER — Encounter: Payer: Self-pay | Admitting: Family Medicine

## 2010-04-21 ENCOUNTER — Ambulatory Visit: Admit: 2010-04-21 | Payer: Self-pay | Admitting: Family Medicine

## 2010-04-21 NOTE — Letter (Signed)
Summary: [facility]  [facility]   Imported By: Lanelle Bal 03/15/2010 10:10:13  _____________________________________________________________________  External Attachment:    Type:   Image     Comment:   External Document

## 2010-04-21 NOTE — Progress Notes (Signed)
Summary: wants appt with another neurologist sooner than Feb 2012  Phone Note Call from Patient Call back at [facility] Phone [phone]   Caller: Patient Summary of Call: patient says that she was given an appointment for the Neurologist for 05/05/2010 and she wants to see someone sooner---can Dr Beverely Low refer her to another doctor who can give her a better appointment?? Initial call taken by: Jerolyn Shin,  March 10, 2010 4:19 PM  Follow-up for Phone Call        Pls advise ..........Marland KitchenFelecia Deloach CMA  March 10, 2010 4:25 PM   Additional Follow-up for Phone Call Additional follow up Details #1::        she can call and see if there's a cancellation but there is only one large neurology group in [place].  Maybe Renee can assist [place]... Additional Follow-up by: Neena Rhymes MD,  March 10, 2010 4:38 PM    Additional Follow-up for Phone Call Additional follow up Details #2::    Advise Pt of Dr Beverely Low suggestion also spoke briefly with Renee who states that everyone is booking out to the same dates. Pt advised to contact them in regards to a cancellation list..........Marland KitchenFelecia Deloach CMA  March 10, 2010 4:56 PM

## 2010-04-21 NOTE — Progress Notes (Signed)
Summary: CT REFERRAL  Phone Note Call from Patient   Caller: Patient Call For: Neena Rhymes MD Summary of Call: IN REFERENCE TO CT OF CHEST,  Patient called to get correct phone number---she needs to cancel tomorrow's appt and reschedule--gave her number for [place] on [address].Marland KitchenMarland KitchenJerolyn Shin  April 12, 2010 4:01 PM  PER ROSE IN CT, PATIENT ONLY CANCELLED & STATED SHE WILL NOT RSC'D Magdalen Spatz [facility]  April 12, 2010 4:31 PM    Follow-up for Phone Call        noted. Follow-up by: Neena Rhymes MD,  April 13, 2010 7:57 AM

## 2010-04-21 NOTE — Assessment & Plan Note (Signed)
Summary: followup appt for lots of issues--she says CPX was "never com...   Vital Signs:  Patient profile:   70 year old female Weight:      158 pounds BMI:     28.09 Pulse rate:   62 / minute BP sitting:   122 / 78  (left arm)  Vitals Entered By: Doristine Devoid CMA (April 07, 2010 9:52 AM) CC: concerns about would like CT scan or angiotest   History of Present Illness: 70 yo woman here today for 'sharp pains in my heart.  i almost pass out it hurts so bad'.  reports she's having palpitations, having labile BPs.  has had normal nuclear stress test w/ [facility].  pt reports that 'all GI stuff is clear.  Dr Ronney Asters took care of that.  I have no problems there'.  taking Aciphex.  pains are 'few and far between' but 'they're worsening'.  reports she must hold her breath when sxs occur b/c of the pain.  will last 5-6 minutes at a time.  last had a pain 2 weeks ago.  denies anxiety- 'nothing scares me'.    visual changes- will have 'white flashes', 'it's like white water running down'.  still present when she closes her eye.  will occur in absence of HA.  had normal eye exam in Sept.  has appt w/ Neuro in Feb.  denies facial droop or slurred speech.  sxs never occur on the R, only on the L.  denies jaw claudication, PMR sxs.   Current Medications (verified): 1)  Aciphex 20 Mg Tbec (Rabeprazole Sodium) .... Take 1 Tab Every Morning 2)  Prednisone 20 Mg Tabs (Prednisone) .... 2 Tabs Daily X10 Days  Allergies (verified): No Known Drug Allergies  Past History:  Past medical, surgical, family and social histories (including risk factors) reviewed for relevance to current acute and chronic problems.  Past Medical History: Reviewed history from 07/22/2009 and no changes required. [id number] colonoscopy in 09 Dr Noe Gens [facility] -- tortuous colon chronic constipation esophagitis Dr Ronney Asters is GI- follows regularly  Past Surgical History: Reviewed history from 11/25/2008 and no changes  required. 2 benign breast tumor removed, benign TAH with BSO for fibroids and ovarian cysts Appy 2008 esophageal dilation  Family History: Reviewed history from 07/22/2009 and no changes required. Father drowned Mother died from a PE 2 brothers died - Unknown at 6, mesothelioma. 3 sisters living - HTN. no colon cancer, breast cancer MGM- CVA  Social History: Reviewed history from 10/27/2008 and no changes required. 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 Widowed x 13 yrs. Lives alone. Never smoked.  No ETOH. No relationships.  Goes to church. Enjoys aerobics.  Review of Systems      See HPI  Physical Exam  General:  alert, well-developed, well-nourished, and well-hydrated.  upset Head:  normocephalic.  + TTP over L temporal artery. Eyes:  sclera non icteric, PERRL, EOMI, fundi WNL Ears:  EACs patent; TMs translucent and gray with good cone of light and bony landmarks.  Nose:  no nasal discharge.   Neck:  No deformities, masses, or tenderness noted. Chest Wall:  No deformities, masses, or tenderness noted. Lungs:  Normal respiratory effort, chest expands symmetrically. Lungs are clear to auscultation, no crackles or wheezes. Heart:  Normal rate and regular rhythm. S1 and S2 normal without gallop, murmur, click, rub or other extra sounds. Pulses:  +2 carotid, radial, DP Extremities:  No clubbing, cyanosis, edema, or deformity noted with normal full range of motion of all  joints.   Neurologic:  strength normal in all extremities, sensation intact to light touch, gait normal, and DTRs symmetrical and normal.   Skin:  color normal.   Psych:  obviously upset today   Impression & Recommendations:  Problem # 1:  ? of GIANT CELL ARTERITIS (ICD-446.5) Assessment New given TTP over L temporal artery must r/o arteritis.  she denies jaw claudication and PMR sxs which typically accompany temporal arteritis.  get ESR and CRP.  called vascular and neuro to determine if bx is required.  neuro  recommended [place] of temporal arteries to assess for 'halo effect' showing inflammation.  start on steroids to prevent further inflammation, damage, and blindness.  will follow closely. Orders: Venipuncture (04540) Radiology Referral (Radiology) Specimen Handling (98119) TLB-Sedimentation Rate (ESR) (85652-ESR) TLB-CRP-High Sensitivity (C-Reactive Protein) (86140-FCRP) Prescription Created Electronically [phone])  Problem # 2:  CHEST PAIN (ICD-786.50) Assessment: Deteriorated pt would like 2nd opinion from cards in regards to her CP.  she feels that even though she has had normal stress test and w/u w/ [facility] that there is something wrong.  the sharp, brief pains she is describing would be atypical for cardiac but will get 2nd opinion at her request.  will also get chest CT to r/o other process. Orders: Cardiology Referral (Cardiology) Radiology Referral (Radiology)  Problem # 3:  VISUAL CHANGES (ICD-368.10) Assessment: New unclear if these are related to ? temporal arteritis or something different.  reports she recently had normal eye exam.  has neuro appt scheduled.  has normal neuro exam- will get carotid dopplers.  may need head CT if she continues to have sxs and w/u remains (-).  Pt expresses understanding and is in agreement w/ this plan.  Complete Medication List: 1)  Aciphex 20 Mg Tbec (Rabeprazole sodium) .... Take 1 tab every morning 2)  Prednisone 20 Mg Tabs (Prednisone) .... 2 tabs daily x10 days  Patient Instructions: 1)  Please schedule a follow-up appointment in 2 weeks to see how you are feeling. 2)  Take the Prednisone daily until we determine why your L temple hurts 3)  We will notify you of your lab results and get your ultrasound appt 4)  We will call you with your cardiology appt to get a 2nd opinion on your chest pain 5)  We will order a CT of your chest to see if we can figure out the cause of your pain 6)  Hang in there!! Prescriptions: PREDNISONE 20 MG TABS  (PREDNISONE) 2 tabs daily x10 days  #20 x 0   Entered and Authorized by:   Neena Rhymes MD   Signed by:   Neena Rhymes MD on 04/07/2010   Method used:   Electronically to        [facility]* (retail)       [address] [place], [place]  [address]       Ph: [phone]       Fax: [phone]   RxID:   [id number]    Orders Added: 1)  Venipuncture [47425] 2)  Radiology Referral [Radiology] 3)  Specimen Handling [99000] 4)  Cardiology Referral [Cardiology] 5)  Radiology Referral [Radiology] 6)  TLB-Sedimentation Rate (ESR) [85652-ESR] 7)  TLB-CRP-High Sensitivity (C-Reactive Protein) [86140-FCRP] 8)  Prescription Created Electronically [G8553] 9)  Est. Patient Level IV [95638]

## 2010-04-27 ENCOUNTER — Encounter: Payer: Self-pay | Admitting: Family Medicine

## 2010-04-27 ENCOUNTER — Ambulatory Visit (HOSPITAL_COMMUNITY)
Admission: RE | Admit: 2010-04-27 | Discharge: 2010-04-27 | Disposition: A | Discharge status: Home / Self Care | Payer: Medicare Other | Source: Ambulatory Visit | Attending: Family Medicine | Admitting: Family Medicine

## 2010-04-27 DIAGNOSIS — R0989 Other specified symptoms and signs involving the circulatory and respiratory systems: Secondary | ICD-10-CM

## 2010-04-27 DIAGNOSIS — R51 Headache: Secondary | ICD-10-CM | POA: Insufficient documentation

## 2010-05-02 ENCOUNTER — Encounter: Payer: Self-pay | Admitting: Cardiology

## 2010-05-02 ENCOUNTER — Ambulatory Visit (INDEPENDENT_AMBULATORY_CARE_PROVIDER_SITE_OTHER): Payer: Medicare Other | Admitting: Cardiology

## 2010-05-02 DIAGNOSIS — R079 Chest pain, unspecified: Secondary | ICD-10-CM

## 2010-05-04 ENCOUNTER — Encounter: Payer: Self-pay | Admitting: Family Medicine

## 2010-05-10 ENCOUNTER — Encounter (INDEPENDENT_AMBULATORY_CARE_PROVIDER_SITE_OTHER): Payer: Self-pay | Admitting: *Deleted

## 2010-05-11 ENCOUNTER — Encounter: Payer: Self-pay | Admitting: Cardiology

## 2010-05-11 ENCOUNTER — Other Ambulatory Visit: Payer: Self-pay | Admitting: Cardiology

## 2010-05-11 ENCOUNTER — Other Ambulatory Visit (INDEPENDENT_AMBULATORY_CARE_PROVIDER_SITE_OTHER): Payer: Medicare Other

## 2010-05-11 ENCOUNTER — Ambulatory Visit (HOSPITAL_COMMUNITY): Payer: Medicare Other | Attending: Cardiology

## 2010-05-11 DIAGNOSIS — I079 Rheumatic tricuspid valve disease, unspecified: Secondary | ICD-10-CM | POA: Insufficient documentation

## 2010-05-11 DIAGNOSIS — R079 Chest pain, unspecified: Secondary | ICD-10-CM

## 2010-05-11 DIAGNOSIS — K219 Gastro-esophageal reflux disease without esophagitis: Secondary | ICD-10-CM | POA: Insufficient documentation

## 2010-05-11 DIAGNOSIS — I10 Essential (primary) hypertension: Secondary | ICD-10-CM | POA: Insufficient documentation

## 2010-05-11 DIAGNOSIS — I080 Rheumatic disorders of both mitral and aortic valves: Secondary | ICD-10-CM | POA: Insufficient documentation

## 2010-05-11 DIAGNOSIS — R072 Precordial pain: Secondary | ICD-10-CM | POA: Insufficient documentation

## 2010-05-11 LAB — BASIC METABOLIC PANEL
BUN: 9 mg/dL (ref 6–23)
CO2: 27 mEq/L (ref 19–32)
Calcium: 9.1 mg/dL (ref 8.4–10.5)
Creatinine, Ser: 0.8 mg/dL (ref 0.4–1.2)
Glucose, Bld: 69 mg/dL — ABNORMAL LOW (ref 70–99)

## 2010-05-11 NOTE — Assessment & Plan Note (Signed)
Summary: np6/chest pains, per renee [phone] pt have medicare-mb/agh   Primary Provider:  Neena Rhymes MD   History of Present Illness: 70 yo with history of GERD/esophagitis presents for evaluation of chest pain.  For the last 2 years, she has had episodes of severe 10/10 left-sided chest tightness.  These episodes will last for 3-4 minutes at a time and will occur every month or two.  They do not seem to be exertional, and she can identify no particular trigger.  They do not feel like her GERD pain.  When she has this severe pain, she will also get lightheaded though she has never passed out.  No tachypalpitations.  She also gets a separate achy central chest pain that is more mild and occurs 2-3 times a week, lasting for a few minutes.  She will often get this pain lying in bed.  As above, pain is not exertional.  No exertional dyspnea or orthopnea.  Last year, she was admitted to [facility] with this chest pain and had a myoview, showing no ischemia or infarction.  She continues to have these episodes and is very worried that it is her heart and is frustrated that she has no diagnosis.  Additionally, he BP seems to run high when she is in doctors' offices.  It is 154/90 today.  However, SBP is always < 140 at home when she checks it (which is frequently).   ECG: NSR, normal  Labs (10/11): LDL 115, HDL 57 Labs (1/12): creatinine 0.9, ESR/CRP normal  Current Medications (verified): 1)  Aciphex 20 Mg Tbec (Rabeprazole Sodium) .... Take 1 Tab Every Morning-Prn 2)  Vitamin C 500 Mg Chew (Ascorbic Acid) .... Once Daily 3)  Miralax  Powd (Polyethylene Glycol 3350) .... Once Daily 4)  Natural Vitamin E 1000 Unit Caps (Vitamin E) .... Once Daily  Allergies (verified): No Known Drug Allergies  Past History:  Past Medical History: 1. BE -- tortuous colon 2. chronic constipation 3. GERD/esophagitis: Dr Ronney Asters is GI- follows regularly 4. Carotid US (2/12): minimal carotid disease.  5.  TAH/BSO 6. Chest pain: ETT-myoview (5/11) with no ischemic or infarction.   [id number] colonoscopy in 09 Dr Noe Gens  Family History: Father drowned Mother died from a PE 2 brothers died - Unknown at 69, mesothelioma. 3 sisters living - HTN. 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  From [place] originally.  Moved to [place] several years ago.  Widowed x 13 yrs. Lives alone. Never smoked.  No ETOH. No relationships.  Goes to church. Enjoys aerobics.  Review of Systems       All systems reviewed and negative except as per HPI.   Vital Signs:  Patient profile:   70 year old female Height:      63 inches Weight:      159 pounds BMI:     28.27 Pulse rate:   68 / minute Pulse rhythm:   regular BP sitting:   154 / 90  (left arm) Cuff size:   regular  Vitals Entered By: Judithe Modest CMA (May 02, 2010 9:43 AM)  Physical Exam  General:  Well developed, well nourished, in no acute distress. Head:  normocephalic and atraumatic Nose:  no deformity, discharge, inflammation, or lesions Mouth:  Teeth, gums and palate normal. Oral mucosa normal. Neck:  Neck supple, no JVD. No masses, thyromegaly or abnormal cervical nodes. Lungs:  Clear bilaterally to auscultation and percussion. Heart:  Non-displaced PMI, chest non-tender; regular rate and rhythm, S1, S2 without murmurs, rubs or gallops. Carotid upstroke normal, no bruit. Pedals normal pulses. No edema, no varicosities. Abdomen:  Bowel sounds positive; abdomen soft and non-tender without masses, organomegaly, or hernias noted. No hepatosplenomegaly. Extremities:  No clubbing or cyanosis. Neurologic:  Alert and oriented x 3. Skin:  Intact without lesions or rashes. Psych:  Normal affect.   Impression & Recommendations:  Problem # 1:  CHEST PAIN (ICD-786.50) Patient has ongoing atypical chest pain.  She had a  negative myoview about a year ago.  She is very worried that the chest pain could be cardiac in nature.  GIven the ongoing symptoms, I think that it would be reasonable to try to directly image the coronaries.  Rather than having her undergo an invasive procedure, I think that a coronary CT angiogram would be reasonable in this situation.  I will arrange for this test.  I would also like her to followup with her GI MD, Dr Matthias Hughs.  It is possible that the symptoms are related to GERD/esophagitis.  She is already on a PPI.   Problem # 2:  HYPERTENSION, UNSPECIFIED (ICD-401.9) BP is elevated today and has been elevated at past office visits.  However, her home readings are all within normal range.  I will have her bring her cuff to the next appointment to see if it matches reasonably with our cuff here.  I will also get an echo to assess for LV hypertrophy.   Other Orders: Cardiac CTA (Cardiac CTA) Echocardiogram (Echo)  Patient Instructions: 1)  Your physician has requested that you have an echocardiogram.  Echocardiography is a painless test that uses sound waves to create images of your heart. It provides your doctor with information about the size and shape of your heart and how well your heart's chambers and valves are working.  This procedure takes approximately one hour. There are no restrictions for this procedure. 2)  Your physician has requested that you have a cardiac CT.  Cardiac computed tomography (CT) is a painless test that uses an x-ray machine to take clear, detailed pictures of your heart.  For further information please visit [email or web address]  Please follow instruction sheet as given. 3)  Take metoprolol 25mg  the night before the coronary CT and metoprolol 25mg  the morning of the coronary CT. 4)  Your physician recommends that you schedule a follow-up appointment in: 2 weeks with Dr Shirlee Latch after you have had the testing completed. 5)  Bring your blood pressure cuff to the  appointment with Dr Shirlee Latch. Prescriptions: METOPROLOL TARTRATE 25 MG TABS (METOPROLOL TARTRATE) one tablet the night before the coronary CT and  one tablet the morning of the coronary CT  #2 x 0   Entered by:   Katina Dung, RN, BSN   Authorized by:   Marca Ancona, MD   Signed by:   Katina Dung, RN, BSN on 05/02/2010   Method used:   Electronically to        [facility]* (retail)       [address] [place], [place]  [address]       Ph: [phone]       Fax: [phone]   RxID:   [id number]

## 2010-05-17 NOTE — Letter (Signed)
Summary: Generic Letter  [facility], Main Office  [address]   [place], [place] [address]   Phone: [phone]  Fax: [phone]        May 10, 2010 MRN: [id number]    [facility] [address] [place], [place]  [address]    Dear Ms. [facility],    Dr. Shirlee Latch has ordered you to have Cardiac CT on : 05/27/10 at 2pm.  DO NOT EAT OR DRINK ANYTHING AFTER 10AM, THE MORNING OF YOUR PROCEDURE.  PLEASE ARRIVE 1 HOUR PRIOR TO APPOINTMENT TIME,AT THE [place] OUT-PATIENT ADMISSION OFFICE LOCATED ON THE FIRST FLOOR NEAR THE GIFT SHOP.   Sincerely,  Merita Norton Lloyd-Fate

## 2010-05-26 ENCOUNTER — Telehealth: Payer: Self-pay | Admitting: Cardiology

## 2010-05-26 NOTE — Letter (Signed)
Summary: [facility]  [facility]   Imported By: Maryln Gottron 05/17/2010 10:29:14  _____________________________________________________________________  External Attachment:    Type:   Image     Comment:   External Document

## 2010-05-27 ENCOUNTER — Other Ambulatory Visit (HOSPITAL_COMMUNITY): Payer: Medicare Other

## 2010-05-31 NOTE — Progress Notes (Signed)
Summary: CTA scheduled for 05/27/10  ---- Converted from flag ---- ---- 05/26/2010 5:13 PM, Connye Burkitt wrote: Ms. Grout has canceled her appt. Patient states she has to have a colonoscopy. Merita Norton Lloyd-Fate  May 26, 2010 5:12 PM   CTA SCHEDULED FOR 05/27/10 @ 2PM WITH DR.NISHAN [facility] BY SHARONDA/Kim [place] Lloyd-Fate  May 05, 2010 9:46 AM   Special Instructions Take metoprolol 25mg  the night before the  coronary CT and metorpolol 25mg  the morning of the coronary  C  ---- 05/26/2010 9:26 AM, Merita Norton Lloyd-Fate wrote: ------------------------------

## 2010-06-02 ENCOUNTER — Ambulatory Visit: Payer: Medicare Other | Admitting: Cardiology

## 2010-06-07 LAB — URINALYSIS, ROUTINE W REFLEX MICROSCOPIC
Bilirubin Urine: NEGATIVE
Glucose, UA: NEGATIVE mg/dL
Hgb urine dipstick: NEGATIVE
Ketones, ur: NEGATIVE mg/dL
Nitrite: NEGATIVE
Protein, ur: NEGATIVE mg/dL
Specific Gravity, Urine: 1.004 — ABNORMAL LOW (ref 1.005–1.030)
Urobilinogen, UA: 0.2 mg/dL (ref 0.0–1.0)
pH: 7.5 (ref 5.0–8.0)

## 2010-06-07 LAB — URINE MICROSCOPIC-ADD ON

## 2010-06-07 LAB — DIFFERENTIAL
Basophils Absolute: 0 10*3/uL (ref 0.0–0.1)
Basophils Relative: 1 % (ref 0–1)
Eosinophils Absolute: 0.1 10*3/uL (ref 0.0–0.7)
Eosinophils Relative: 2 % (ref 0–5)
Lymphocytes Relative: 42 % (ref 12–46)
Lymphs Abs: 2.8 10*3/uL (ref 0.7–4.0)
Monocytes Absolute: 0.9 10*3/uL (ref 0.1–1.0)
Monocytes Relative: 13 % — ABNORMAL HIGH (ref 3–12)
Neutro Abs: 2.7 10*3/uL (ref 1.7–7.7)
Neutrophils Relative %: 42 % — ABNORMAL LOW (ref 43–77)

## 2010-06-07 LAB — BASIC METABOLIC PANEL
BUN: 7 mg/dL (ref 6–23)
CO2: 30 mEq/L (ref 19–32)
Calcium: 9.1 mg/dL (ref 8.4–10.5)
Chloride: 103 mEq/L (ref 96–112)
Creatinine, Ser: 0.96 mg/dL (ref 0.4–1.2)
GFR calc Af Amer: >60 mL/min (ref >60)
GFR calc non Af Amer: 58 mL/min — ABNORMAL LOW (ref >60)
Glucose, Bld: 81 mg/dL (ref 70–99)
Potassium: 4.1 mEq/L (ref 3.5–5.1)
Sodium: 137 mEq/L (ref 135–145)

## 2010-06-07 LAB — POCT CARDIAC MARKERS
CKMB, poc: 1.3 ng/mL (ref 1.0–8.0)
Myoglobin, poc: 69.9 ng/mL (ref 12–200)
Troponin i, poc: <0.05 ng/mL (ref 0.00–0.09)

## 2010-06-07 LAB — CBC
HCT: 39.7 % (ref 36.0–46.0)
Hemoglobin: 13.8 g/dL (ref 12.0–15.0)
MCHC: 34.8 g/dL (ref 30.0–36.0)
MCV: 89.5 fL (ref 78.0–100.0)
Platelets: 216 10*3/uL (ref 150–400)
RBC: 4.44 MIL/uL (ref 3.87–5.11)
RDW: 12.8 % (ref 11.5–15.5)
WBC: 6.5 10*3/uL (ref 4.0–10.5)

## 2010-06-07 LAB — BRAIN NATRIURETIC PEPTIDE: Pro B Natriuretic peptide (BNP): 58 pg/mL (ref 0.0–100.0)

## 2010-06-15 ENCOUNTER — Other Ambulatory Visit: Payer: Self-pay | Admitting: Gastroenterology

## 2010-06-15 DIAGNOSIS — R1011 Right upper quadrant pain: Secondary | ICD-10-CM

## 2010-06-22 ENCOUNTER — Ambulatory Visit
Admission: RE | Admit: 2010-06-22 | Discharge: 2010-06-22 | Disposition: A | Discharge status: Home / Self Care | Payer: Medicare Other | Source: Ambulatory Visit | Attending: Gastroenterology | Admitting: Gastroenterology

## 2010-06-22 DIAGNOSIS — R1011 Right upper quadrant pain: Secondary | ICD-10-CM

## 2010-06-27 ENCOUNTER — Telehealth: Payer: Self-pay | Admitting: *Deleted

## 2010-06-27 DIAGNOSIS — Z0000 Encounter for general adult medical examination without abnormal findings: Secondary | ICD-10-CM

## 2010-06-27 NOTE — Telephone Encounter (Signed)
Please enter referral and fax to imaging center.

## 2010-07-27 ENCOUNTER — Encounter: Payer: Self-pay | Admitting: Family Medicine

## 2010-08-10 ENCOUNTER — Encounter: Payer: Self-pay | Admitting: Family Medicine

## 2010-08-24 ENCOUNTER — Ambulatory Visit (INDEPENDENT_AMBULATORY_CARE_PROVIDER_SITE_OTHER): Payer: Medicare Other | Admitting: Family Medicine

## 2010-08-24 ENCOUNTER — Encounter: Payer: Self-pay | Admitting: Family Medicine

## 2010-08-24 VITALS — BP 140/80 | Temp 98.6°F | Wt 156.1 lb

## 2010-08-24 DIAGNOSIS — M549 Dorsalgia, unspecified: Secondary | ICD-10-CM

## 2010-08-24 NOTE — Progress Notes (Signed)
  Subjective:    Patient ID: Mackenzie Estes, female    DOB: 10/03/1940, 70 y.o.   MRN: [id number]  HPI Back pain-  Thoracic back pain, started 2 weeks ago.  Pain w/ cough and deep breath.  Denies CP, SOB, swelling or redness of legs.  No pain w/ movement.  She is wondering if she should see a chiropractic 'to help w/ my alignment'   Review of Systems For ROS see HPI     Objective:   Physical Exam  Constitutional: She appears well-developed and well-nourished. No distress.  Cardiovascular: Normal rate, regular rhythm, normal heart sounds and intact distal pulses.   Pulmonary/Chest: Effort normal and breath sounds normal. No respiratory distress. She has no wheezes. She has no rales. She exhibits no tenderness.  Musculoskeletal: Normal range of motion. She exhibits tenderness (mild thoracic spine tenderness w/ palpation). She exhibits no edema.          Assessment & Plan:

## 2010-08-24 NOTE — Patient Instructions (Signed)
Please go to [address] [facility] [address] to get your chest xray Take Tylenol arthritis and use a heating pad for pain relief If your chest xray looks good it may be a good idea to see a chiropracter (we recommend [place]) Call with any questions or concerns Hang in there!

## 2010-08-26 ENCOUNTER — Encounter: Payer: Self-pay | Admitting: *Deleted

## 2010-08-26 ENCOUNTER — Ambulatory Visit (INDEPENDENT_AMBULATORY_CARE_PROVIDER_SITE_OTHER)
Admission: RE | Admit: 2010-08-26 | Discharge: 2010-08-26 | Disposition: A | Discharge status: Home / Self Care | Payer: Medicare Other | Source: Ambulatory Visit | Attending: Family Medicine | Admitting: Family Medicine

## 2010-08-26 DIAGNOSIS — M549 Dorsalgia, unspecified: Secondary | ICD-10-CM

## 2010-09-04 NOTE — Assessment & Plan Note (Signed)
Pt's pain is inconsistent w/ cardiac or PE.  Check CXR to r/o compression fx.  Start tylenol for possible arthritis.  If CXR is normal there is no reason to not see chiropractic.  Reviewed supportive care and red flags that should prompt return.  Pt expressed understanding and is in agreement w/ plan.

## 2010-10-27 ENCOUNTER — Ambulatory Visit: Payer: Medicare Other | Admitting: Family Medicine

## 2011-01-14 ENCOUNTER — Emergency Department (HOSPITAL_COMMUNITY)
Admission: EM | Admit: 2011-01-14 | Discharge: 2011-01-14 | Disposition: A | Discharge status: Home / Self Care | Payer: Medicare Other | Attending: Emergency Medicine | Admitting: Emergency Medicine

## 2011-01-14 ENCOUNTER — Emergency Department (HOSPITAL_COMMUNITY): Payer: Medicare Other

## 2011-01-14 DIAGNOSIS — T63481A Toxic effect of venom of other arthropod, accidental (unintentional), initial encounter: Secondary | ICD-10-CM | POA: Insufficient documentation

## 2011-01-14 DIAGNOSIS — T6391XA Toxic effect of contact with unspecified venomous animal, accidental (unintentional), initial encounter: Secondary | ICD-10-CM | POA: Insufficient documentation

## 2011-01-14 DIAGNOSIS — M25519 Pain in unspecified shoulder: Secondary | ICD-10-CM | POA: Insufficient documentation

## 2011-01-14 DIAGNOSIS — R079 Chest pain, unspecified: Secondary | ICD-10-CM | POA: Insufficient documentation

## 2011-01-14 LAB — COMPREHENSIVE METABOLIC PANEL
CO2: 24 mEq/L (ref 19–32)
Calcium: 9.5 mg/dL (ref 8.4–10.5)
Creatinine, Ser: 0.95 mg/dL (ref 0.50–1.10)
GFR calc Af Amer: 69 mL/min — ABNORMAL LOW (ref >90)
GFR calc non Af Amer: 60 mL/min — ABNORMAL LOW (ref >90)
Glucose, Bld: 89 mg/dL (ref 70–99)

## 2011-01-14 LAB — DIFFERENTIAL
Basophils Absolute: 0 10*3/uL (ref 0.0–0.1)
Eosinophils Relative: 2 % (ref 0–5)
Lymphocytes Relative: 39 % (ref 12–46)
Monocytes Absolute: 0.7 10*3/uL (ref 0.1–1.0)

## 2011-01-14 LAB — CBC
HCT: 40.9 % (ref 36.0–46.0)
MCHC: 34 g/dL (ref 30.0–36.0)
MCV: 87.6 fL (ref 78.0–100.0)
RDW: 12.8 % (ref 11.5–15.5)

## 2011-01-14 LAB — POCT I-STAT TROPONIN I

## 2011-03-30 ENCOUNTER — Ambulatory Visit (INDEPENDENT_AMBULATORY_CARE_PROVIDER_SITE_OTHER): Payer: Medicare Other | Admitting: Cardiology

## 2011-03-30 ENCOUNTER — Encounter: Payer: Self-pay | Admitting: Cardiology

## 2011-03-30 DIAGNOSIS — R079 Chest pain, unspecified: Secondary | ICD-10-CM

## 2011-03-30 DIAGNOSIS — I10 Essential (primary) hypertension: Secondary | ICD-10-CM

## 2011-03-30 NOTE — Patient Instructions (Signed)
Your physician wants you to follow-up in: 6 months.  You will receive a reminder letter in the mail two months in advance. If you don't receive a letter, please call our office to schedule the follow-up appointment.  Your physician has recommended you make the following change in your medication: Aspirin 81mg  daily.

## 2011-03-30 NOTE — Progress Notes (Signed)
PCP: Dr. Nehemiah Settle  71 yo with history of GERD/esophagitis and atypical chest pain presents for cardiology followup.  She had a normal myoview in 5/11 and a normal stress echo in 2/12. She returns to the office because of an episode of severe pain radiating from the posterior neck down the arms bilaterally one morning about a month ago.  She woke up with this sensation.  There was no chest pain.  It lasted for about an hour and has not recurred since that time.  She is actually doing well currently.  No chest pain.  No exertional dyspnea.  She is active and does aerobic exercises most mornings.  BP is mildly elevated here but is always < 140 when she checks at home.  Her GERD has resolved with PPI.    Labs (10/11): LDL 115, HDL 57  Labs (1/12): creatinine 0.9, ESR/CRP normal  Labs (10/12): K 4.2, creatinine 0.95  Allergies (verified):  No Known Drug Allergies   Past Medical History:  1. BE -- tortuous colon  2. chronic constipation  3. GERD/esophagitis: Dr Ronney Asters is GI- follows regularly  4. Carotid US (2/12): minimal carotid disease.  5. TAH/BSO  6. Chest pain: ETT-myoview (5/11) with no ischemic or infarction.  Stress Echo (2/12): Normal 7. Echocardiogram (2/12): EF 55-60%, mild LVH, mild AI, MV prolapse without significant MR.  Family History:  Father drowned  Mother died from a PE  2 brothers died - Unknown at 12, mesothelioma.  3 sisters living - HTN.  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  From [place] originally. Moved to [place] several years ago.  Widowed x 13 yrs.  Lives alone.  Never smoked. No ETOH.  No relationships. Goes to church.  Enjoys aerobics.   Current Outpatient Prescriptions  Medication Sig Dispense Refill  . Ascorbic Acid (VITAMIN C) 500 MG CHEW Chew by mouth daily.        . Polyethylene Glycol 3350 (MIRALAX PO) Take by mouth daily.          . Probiotic Product (ALIGN) 4 MG CAPS Take by mouth daily.        . RABEprazole (ACIPHEX) 20 MG tablet Take 20 mg by mouth daily.        . vitamin E 1000 UNIT capsule Take 1,000 Units by mouth daily.        Marland Kitchen aspirin EC 81 MG tablet Take 1 tablet (81 mg total) by mouth daily.  150 tablet  2    BP 146/78  Pulse 80  Ht 5' 3.5" (1.613 m)  Wt 72.576 kg (160 lb)  BMI 27.90 kg/m2 General: NAD Neck: No JVD, no thyromegaly or thyroid nodule.  Lungs: Clear to auscultation bilaterally with normal respiratory effort. CV: Nondisplaced PMI.  Heart regular S1/S2, no S3/S4, no murmur.  No peripheral edema.  No carotid bruit.  Normal pedal pulses.  Abdomen: Soft, nontender, no hepatosplenomegaly, no distention.  Neurologic: Alert and oriented x 3.  Psych: Normal affect. Extremities: No clubbing or cyanosis.

## 2011-03-31 NOTE — Assessment & Plan Note (Signed)
I think that Mackenzie Estes's symptoms are noncardiac.  She has had a negative myoview in 5/11 and negative stress echo in 2/12.  Suspect chest pain, when she gets it, is due to GERD.  Neck pain radiating down arms certainly may have been related to C-spine arthritis.  She should take ASA 81 mg daily but no further recommendations.

## 2011-03-31 NOTE — Assessment & Plan Note (Signed)
She will monitor BP at home.  It is mildly elevated today but has been ok at home.  I am a bit concerned as she does have mild LVH on echo.

## 2011-04-21 ENCOUNTER — Encounter: Payer: Self-pay | Admitting: Cardiology

## 2011-04-21 ENCOUNTER — Ambulatory Visit: Payer: Medicare Other | Admitting: Physician Assistant

## 2011-04-21 ENCOUNTER — Ambulatory Visit (INDEPENDENT_AMBULATORY_CARE_PROVIDER_SITE_OTHER): Payer: Medicare Other | Admitting: Cardiology

## 2011-04-21 VITALS — BP 140/74 | HR 76 | Wt 158.0 lb

## 2011-04-21 DIAGNOSIS — R079 Chest pain, unspecified: Secondary | ICD-10-CM

## 2011-04-21 MED ORDER — OMEPRAZOLE 20 MG PO CPDR: 20.0000 mg | DELAYED_RELEASE_CAPSULE | Freq: Every day | ORAL | Status: DC

## 2011-04-21 NOTE — Progress Notes (Signed)
PCP: Dr. Nehemiah Settle  71 yo with history of GERD/esophagitis and atypical chest pain presents for cardiology followup.  She had a normal myoview in 5/11 and a normal stress echo in 2/12. She is back to the office soon after last appointment with episodes of epigastric burning after eating.  She also continues to get right-sided neck aching with radiation to the right arm.  Symptoms seem to have worsened since she ran out of her rabeprazole.  She saw her gastroenterologist not long ago and was started on probiotics.    Labs (10/11): LDL 115, HDL 57  Labs (1/12): creatinine 0.9, ESR/CRP normal  Labs (10/12): K 4.2, creatinine 0.95  Allergies (verified):  No Known Drug Allergies   Past Medical History:  1. BE -- tortuous colon  2. chronic constipation  3. GERD/esophagitis: Dr Ronney Asters is GI- follows regularly  4. Carotid US (2/12): minimal carotid disease.  5. TAH/BSO  6. Chest pain: ETT-myoview (5/11) with no ischemic or infarction.  Stress Echo (2/12): Normal 7. Echocardiogram (2/12): EF 55-60%, mild LVH, mild AI, MV prolapse without significant MR.  Family History:  Father drowned  Mother died from a PE  2 brothers died - Unknown at 4, mesothelioma.  3 sisters living - HTN.  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  From [place] originally. Moved to [place] several years ago.  Widowed x 13 yrs.  Lives alone.  Never smoked. No ETOH.  No relationships. Goes to church.  Enjoys aerobics.   Current Outpatient Prescriptions  Medication Sig Dispense Refill  . Ascorbic Acid (VITAMIN C) 500 MG CHEW Chew by mouth daily.        Marland Kitchen aspirin EC 81 MG tablet Take 1 tablet (81 mg total) by mouth daily.  150 tablet  2  . Polyethylene Glycol 3350 (MIRALAX PO) Take by mouth daily.        . Probiotic Product (ALIGN) 4 MG CAPS Take by mouth daily.        . RABEprazole (ACIPHEX) 20 MG  tablet Take 20 mg by mouth daily.        . vitamin E 1000 UNIT capsule Take 1,000 Units by mouth daily.        Marland Kitchen omeprazole (PRILOSEC) 20 MG capsule Take 1 capsule (20 mg total) by mouth daily.  30 capsule  6    BP 140/74  Pulse 76  Wt 71.668 kg (158 lb) General: NAD Neck: No JVD, no thyromegaly or thyroid nodule.  Lungs: Clear to auscultation bilaterally with normal respiratory effort. CV: Nondisplaced PMI.  Heart regular S1/S2, no S3/S4, no murmur.  No peripheral edema.  No carotid bruit.  Normal pedal pulses.  Abdomen: Soft, nontender, no hepatosplenomegaly, no distention.  Neurologic: Alert and oriented x 3.  Psych: Normal affect. Extremities: No clubbing or cyanosis.

## 2011-04-21 NOTE — Patient Instructions (Signed)
Start omeprazole 20mg  daily.   Your physician wants you to follow-up in: 6 months with Dr Shirlee Latch. (August 2013). You will receive a reminder letter in the mail two months in advance. If you don't receive a letter, please call our office to schedule the follow-up appointment.

## 2011-04-21 NOTE — Assessment & Plan Note (Signed)
I think that Mackenzie Estes's symptoms are noncardiac.  She has had a negative myoview in 5/11 and negative stress echo in 2/12.  I suspect that her symptoms are GERD-related.   Neck pain radiating down arms certainly may have been related to C-spine arthritis.  She has been off her PPI.  I will start her on omeprazole 20 mg bid.  She can followup with her gastroenterologist about this as needed.

## 2011-08-24 DIAGNOSIS — M25519 Pain in unspecified shoulder: Secondary | ICD-10-CM | POA: Diagnosis not present

## 2011-08-24 DIAGNOSIS — R030 Elevated blood-pressure reading, without diagnosis of hypertension: Secondary | ICD-10-CM | POA: Diagnosis not present

## 2011-08-25 DIAGNOSIS — K5901 Slow transit constipation: Secondary | ICD-10-CM | POA: Diagnosis not present

## 2011-08-25 DIAGNOSIS — R1011 Right upper quadrant pain: Secondary | ICD-10-CM | POA: Diagnosis not present

## 2011-08-25 DIAGNOSIS — R141 Gas pain: Secondary | ICD-10-CM | POA: Diagnosis not present

## 2011-08-25 DIAGNOSIS — R143 Flatulence: Secondary | ICD-10-CM | POA: Diagnosis not present

## 2011-08-25 DIAGNOSIS — R142 Eructation: Secondary | ICD-10-CM | POA: Diagnosis not present

## 2011-09-06 DIAGNOSIS — Z1231 Encounter for screening mammogram for malignant neoplasm of breast: Secondary | ICD-10-CM | POA: Diagnosis not present

## 2011-09-06 LAB — HM MAMMOGRAPHY

## 2011-10-02 DIAGNOSIS — R5383 Other fatigue: Secondary | ICD-10-CM | POA: Diagnosis not present

## 2011-10-02 DIAGNOSIS — R5381 Other malaise: Secondary | ICD-10-CM | POA: Diagnosis not present

## 2011-10-02 DIAGNOSIS — N898 Other specified noninflammatory disorders of vagina: Secondary | ICD-10-CM | POA: Diagnosis not present

## 2011-10-02 DIAGNOSIS — Z136 Encounter for screening for cardiovascular disorders: Secondary | ICD-10-CM | POA: Diagnosis not present

## 2011-10-02 DIAGNOSIS — N9489 Other specified conditions associated with female genital organs and menstrual cycle: Secondary | ICD-10-CM | POA: Diagnosis not present

## 2011-10-02 DIAGNOSIS — N760 Acute vaginitis: Secondary | ICD-10-CM | POA: Diagnosis not present

## 2011-10-02 DIAGNOSIS — Z0000 Encounter for general adult medical examination without abnormal findings: Secondary | ICD-10-CM | POA: Diagnosis not present

## 2011-10-02 DIAGNOSIS — K5900 Constipation, unspecified: Secondary | ICD-10-CM | POA: Diagnosis not present

## 2011-10-02 DIAGNOSIS — M949 Disorder of cartilage, unspecified: Secondary | ICD-10-CM | POA: Diagnosis not present

## 2011-10-02 DIAGNOSIS — M25519 Pain in unspecified shoulder: Secondary | ICD-10-CM | POA: Diagnosis not present

## 2011-10-02 DIAGNOSIS — M899 Disorder of bone, unspecified: Secondary | ICD-10-CM | POA: Diagnosis not present

## 2011-10-02 DIAGNOSIS — Z23 Encounter for immunization: Secondary | ICD-10-CM | POA: Diagnosis not present

## 2011-10-11 ENCOUNTER — Ambulatory Visit: Payer: Medicare Other | Admitting: Physical Therapy

## 2011-10-17 DIAGNOSIS — N9489 Other specified conditions associated with female genital organs and menstrual cycle: Secondary | ICD-10-CM | POA: Diagnosis not present

## 2011-10-18 DIAGNOSIS — R05 Cough: Secondary | ICD-10-CM | POA: Diagnosis not present

## 2011-10-18 DIAGNOSIS — R059 Cough, unspecified: Secondary | ICD-10-CM | POA: Diagnosis not present

## 2011-10-24 ENCOUNTER — Other Ambulatory Visit: Payer: Self-pay | Admitting: Family Medicine

## 2011-10-24 DIAGNOSIS — N9489 Other specified conditions associated with female genital organs and menstrual cycle: Secondary | ICD-10-CM

## 2011-11-07 ENCOUNTER — Ambulatory Visit
Admission: RE | Admit: 2011-11-07 | Discharge: 2011-11-07 | Disposition: A | Discharge status: Home / Self Care | Payer: Medicare Other | Source: Ambulatory Visit | Attending: Family Medicine | Admitting: Family Medicine

## 2011-11-07 DIAGNOSIS — N9489 Other specified conditions associated with female genital organs and menstrual cycle: Secondary | ICD-10-CM

## 2011-11-07 DIAGNOSIS — Z90710 Acquired absence of both cervix and uterus: Secondary | ICD-10-CM | POA: Diagnosis not present

## 2011-11-07 DIAGNOSIS — K5730 Diverticulosis of large intestine without perforation or abscess without bleeding: Secondary | ICD-10-CM | POA: Diagnosis not present

## 2011-11-07 MED ORDER — IOHEXOL 300 MG/ML  SOLN
100.0000 mL | Freq: Once | INTRAMUSCULAR | Status: AC | PRN
  Administered 2011-11-07: 100 mL via INTRAVENOUS

## 2011-12-25 ENCOUNTER — Ambulatory Visit: Payer: Medicare Other | Admitting: Cardiology

## 2012-01-09 ENCOUNTER — Ambulatory Visit (INDEPENDENT_AMBULATORY_CARE_PROVIDER_SITE_OTHER): Payer: Medicare Other | Admitting: Cardiology

## 2012-01-09 ENCOUNTER — Encounter: Payer: Self-pay | Admitting: Cardiology

## 2012-01-09 VITALS — BP 140/83 | HR 71 | Ht 63.5 in | Wt 161.0 lb

## 2012-01-09 DIAGNOSIS — I10 Essential (primary) hypertension: Secondary | ICD-10-CM

## 2012-01-09 DIAGNOSIS — R079 Chest pain, unspecified: Secondary | ICD-10-CM

## 2012-01-09 NOTE — Patient Instructions (Addendum)
Your physician recommends that you schedule a follow-up appointment as needed with Dr McLean.  

## 2012-01-10 NOTE — Progress Notes (Signed)
Patient ID: Mackenzie Estes, female   DOB: 1940-04-12, 71 y.o.   MRN: [id number] PCP: Dr. Nehemiah Estes  71 yo with history of GERD/esophagitis and atypical chest pain presents for cardiology followup.  She had a normal myoview in 5/11 and a normal stress echo in 2/12. She has been doing well since I last saw her with no further chest pain.  BP is upper normal but has been well-controlled when she checks it at home.  He is using a probiotic now rather than a PPI but has not had a recurrence of GERD.  She is walking for exercise with no DOE.    Labs (10/11): LDL 115, HDL 57  Labs (1/12): creatinine 0.9, ESR/CRP normal  Labs (10/12): K 4.2, creatinine 0.95  Allergies (verified):  No Known Drug Allergies   Past Medical History:  1. BE -- tortuous colon  2. chronic constipation  3. GERD/esophagitis: Dr Mackenzie Estes is GI- follows regularly  4. Carotid US (2/12): minimal carotid disease.  5. TAH/BSO  6. Chest pain: ETT-myoview (5/11) with no ischemic or infarction.  Stress Echo (2/12): Normal 7. Echocardiogram (2/12): EF 55-60%, mild LVH, mild AI, MV prolapse without significant MR.  Family History:  Father drowned  Mother died from a PE  2 brothers died - Unknown at 31, mesothelioma.  3 sisters living - HTN.  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  From [place] originally. Moved to [place] several years ago.  Widowed x 70 yrs.  Lives alone.  Never smoked. No ETOH.  No relationships. Goes to church.  Enjoys aerobics.   Current Outpatient Prescriptions  Medication Sig Dispense Refill  . Ascorbic Acid (VITAMIN C) 500 MG CHEW Chew by mouth daily.        Marland Kitchen aspirin EC 81 MG tablet Take 1 tablet (81 mg total) by mouth daily.  150 tablet  2  . Cholecalciferol (VITAMIN D PO) Take by mouth daily.      . Cyanocobalamin (VITAMIN B-12 PO) Take by mouth daily.      . Probiotic Product  (ALIGN) 4 MG CAPS Take by mouth daily.        . Pyridoxine HCl (VITAMIN B-6 PO) Take by mouth daily.      . vitamin E 1000 UNIT capsule Take 1,000 Units by mouth daily.          BP 140/83  Pulse 71  Ht 5' 3.5" (1.613 m)  Wt 161 lb (73.029 kg)  BMI 28.07 kg/m2 General: NAD Neck: No JVD, no thyromegaly or thyroid nodule.  Lungs: Clear to auscultation bilaterally with normal respiratory effort. CV: Nondisplaced PMI.  Heart regular S1/S2, no S3/S4, no murmur.  No peripheral edema.  No carotid bruit.  Normal pedal pulses.  Abdomen: Soft, nontender, no hepatosplenomegaly, no distention.  Neurologic: Alert and oriented x 3.  Psych: Normal affect. Extremities: No clubbing or cyanosis.   Assessment/Plan:  CHEST PAIN  Mrs Mackenzie Estes has had frequent past episodes of atypical chest pain. She has had a negative myoview in 5/11 and negative stress echo in 2/12. I think that her symptoms may have been GERD-related.  She has not had recent chest pain.  She should continue ASA 81 mg daily.  She can followup in this office as needed.   Mackenzie Estes [employer]

## 2012-03-04 ENCOUNTER — Encounter: Payer: Self-pay | Admitting: Cardiology

## 2012-03-04 ENCOUNTER — Ambulatory Visit (INDEPENDENT_AMBULATORY_CARE_PROVIDER_SITE_OTHER): Payer: Medicare Other | Admitting: Cardiology

## 2012-03-04 VITALS — BP 126/70 | HR 72 | Ht 63.5 in | Wt 164.0 lb

## 2012-03-04 DIAGNOSIS — I10 Essential (primary) hypertension: Secondary | ICD-10-CM | POA: Diagnosis not present

## 2012-03-04 DIAGNOSIS — M79609 Pain in unspecified limb: Secondary | ICD-10-CM

## 2012-03-04 DIAGNOSIS — R079 Chest pain, unspecified: Secondary | ICD-10-CM

## 2012-03-04 DIAGNOSIS — M79606 Pain in leg, unspecified: Secondary | ICD-10-CM

## 2012-03-04 LAB — CBC WITH DIFFERENTIAL/PLATELET
Basophils Relative: 0.4 % (ref 0.0–3.0)
Eosinophils Relative: 0.7 % (ref 0.0–5.0)
Lymphocytes Relative: 31.8 % (ref 12.0–46.0)
Neutrophils Relative %: 55 % (ref 43.0–77.0)
RBC: 4.64 Mil/uL (ref 3.87–5.11)
WBC: 6 10*3/uL (ref 4.5–10.5)

## 2012-03-04 LAB — BASIC METABOLIC PANEL
CO2: 31 mEq/L (ref 19–32)
Calcium: 9 mg/dL (ref 8.4–10.5)
Chloride: 102 mEq/L (ref 96–112)
Sodium: 140 mEq/L (ref 135–145)

## 2012-03-04 LAB — MAGNESIUM: Magnesium: 2.2 mg/dL (ref 1.5–2.5)

## 2012-03-04 NOTE — Patient Instructions (Addendum)
Your physician recommends that you have  lab work today--BMET/CBCd/TSH/Magnesium.  Your physician recommends that you schedule a follow-up appointment as needed. If you need to schedule a followup appointment this could be scheduled with the PA or NP.

## 2012-03-05 NOTE — Progress Notes (Signed)
Patient ID: Mackenzie Estes, female   DOB: 1940-10-16, 71 y.o.   MRN: [id number] PCP: Dr. Zachery Dauer  72 yo with history of GERD/esophagitis and atypical chest pain presents for cardiology followup.  She had a normal myoview in 5/11 and a normal stress echo in 2/12.  I saw her recenlty and she was doing well.  She called to come back in because she has been having some chest heaviness.  This always occurs at rest, never with exertion.  It is similar to past symptoms.  She is fatigued in general.  She is also concerned because her legs have been hurting at night.  No symptoms with walking.    Labs (10/11): LDL 115, HDL 57  Labs (1/12): creatinine 0.9, ESR/CRP normal  Labs (10/12): K 4.2, creatinine 0.95  Allergies (verified):  No Known Drug Allergies   Past Medical History:  1. BE -- tortuous colon  2. chronic constipation  3. GERD/esophagitis: Dr Ronney Asters is GI- follows regularly  4. Carotid US (2/12): minimal carotid disease.  5. TAH/BSO  6. Chest pain: ETT-myoview (5/11) with no ischemic or infarction.  Stress Echo (2/12): Normal 7. Echocardiogram (2/12): EF 55-60%, mild LVH, mild AI, MV prolapse without significant MR.  Family History:  Father drowned  Mother died from a PE  2 brothers died - Unknown at 12, mesothelioma.  3 sisters living - HTN.  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  From [place] originally. Moved to [place] several years ago.  Widowed x 13 yrs.  Lives alone.  Never smoked. No ETOH.  No relationships. Goes to church.  Enjoys aerobics.   Current Outpatient Prescriptions  Medication Sig Dispense Refill  . Ascorbic Acid (VITAMIN C) 500 MG CHEW Chew by mouth daily.        Marland Kitchen aspirin EC 81 MG tablet Take 1 tablet (81 mg total) by mouth daily.  150 tablet  2  . Cholecalciferol (VITAMIN D PO) Take by mouth daily.      . Cyanocobalamin (VITAMIN B-12 PO)  Take by mouth daily.      . Probiotic Product (ALIGN) 4 MG CAPS Take by mouth daily.        . Pyridoxine HCl (VITAMIN B-6 PO) Take by mouth daily.      . vitamin E 1000 UNIT capsule Take 1,000 Units by mouth daily.          BP 126/70  Pulse 72  Ht 5' 3.5" (1.613 m)  Wt 164 lb (74.39 kg)  BMI 28.60 kg/m2 General: NAD Neck: No JVD, no thyromegaly or thyroid nodule.  Lungs: Clear to auscultation bilaterally with normal respiratory effort. CV: Nondisplaced PMI.  Heart regular S1/S2, no S3/S4, no murmur.  No peripheral edema.  No carotid bruit.  Normal pedal pulses.  Abdomen: Soft, nontender, no hepatosplenomegaly, no distention.  Neurologic: Alert and oriented x 3.  Psych: Normal affect. Extremities: No clubbing or cyanosis.   Assessment/Plan:  CHEST PAIN  Mackenzie Estes has had frequent past episodes of atypical chest pain. She has had a negative myoview in 5/11 and negative stress echo in 2/12.  I do not think that her current symptoms are cardiac-related. Given fatigue, I will get a CBC and TSH.  Her leg pain does not sound like claudication. She should continue ASA 81 mg daily.  She can followup in this office as needed.  She should see her PCP for further evaluation of presenting complaints.    Marca Ancona 03/05/2012

## 2012-03-18 DIAGNOSIS — R079 Chest pain, unspecified: Secondary | ICD-10-CM | POA: Diagnosis not present

## 2012-03-27 DIAGNOSIS — R071 Chest pain on breathing: Secondary | ICD-10-CM | POA: Diagnosis not present

## 2012-09-13 DIAGNOSIS — Z1231 Encounter for screening mammogram for malignant neoplasm of breast: Secondary | ICD-10-CM | POA: Diagnosis not present

## 2012-09-24 DIAGNOSIS — R599 Enlarged lymph nodes, unspecified: Secondary | ICD-10-CM | POA: Diagnosis not present

## 2012-09-24 DIAGNOSIS — M949 Disorder of cartilage, unspecified: Secondary | ICD-10-CM | POA: Diagnosis not present

## 2012-09-24 DIAGNOSIS — M899 Disorder of bone, unspecified: Secondary | ICD-10-CM | POA: Diagnosis not present

## 2012-09-24 DIAGNOSIS — Z0000 Encounter for general adult medical examination without abnormal findings: Secondary | ICD-10-CM | POA: Diagnosis not present

## 2012-09-24 DIAGNOSIS — K7689 Other specified diseases of liver: Secondary | ICD-10-CM | POA: Diagnosis not present

## 2012-10-03 ENCOUNTER — Ambulatory Visit (HOSPITAL_COMMUNITY)
Admission: RE | Admit: 2012-10-03 | Discharge: 2012-10-03 | Disposition: A | Discharge status: Home / Self Care | Payer: Medicare Other | Source: Ambulatory Visit | Attending: Adult Health | Admitting: Adult Health

## 2012-10-03 ENCOUNTER — Other Ambulatory Visit (HOSPITAL_COMMUNITY): Payer: Self-pay | Admitting: Adult Health

## 2012-10-03 DIAGNOSIS — M169 Osteoarthritis of hip, unspecified: Secondary | ICD-10-CM | POA: Diagnosis not present

## 2012-10-03 DIAGNOSIS — M5137 Other intervertebral disc degeneration, lumbosacral region: Secondary | ICD-10-CM | POA: Diagnosis not present

## 2012-10-03 DIAGNOSIS — G8929 Other chronic pain: Secondary | ICD-10-CM

## 2012-10-03 DIAGNOSIS — M5380 Other specified dorsopathies, site unspecified: Secondary | ICD-10-CM | POA: Diagnosis not present

## 2012-10-03 DIAGNOSIS — M549 Dorsalgia, unspecified: Secondary | ICD-10-CM

## 2012-10-03 DIAGNOSIS — M1610 Unilateral primary osteoarthritis, unspecified hip: Secondary | ICD-10-CM | POA: Diagnosis not present

## 2012-10-03 DIAGNOSIS — M25551 Pain in right hip: Secondary | ICD-10-CM

## 2012-10-03 DIAGNOSIS — M47817 Spondylosis without myelopathy or radiculopathy, lumbosacral region: Secondary | ICD-10-CM | POA: Diagnosis not present

## 2012-10-03 DIAGNOSIS — M51379 Other intervertebral disc degeneration, lumbosacral region without mention of lumbar back pain or lower extremity pain: Secondary | ICD-10-CM | POA: Insufficient documentation

## 2012-10-03 DIAGNOSIS — M25559 Pain in unspecified hip: Secondary | ICD-10-CM | POA: Insufficient documentation

## 2012-10-07 DIAGNOSIS — R1033 Periumbilical pain: Secondary | ICD-10-CM | POA: Diagnosis not present

## 2012-10-07 DIAGNOSIS — M25559 Pain in unspecified hip: Secondary | ICD-10-CM | POA: Diagnosis not present

## 2012-10-07 DIAGNOSIS — IMO0002 Reserved for concepts with insufficient information to code with codable children: Secondary | ICD-10-CM | POA: Diagnosis not present

## 2012-10-07 DIAGNOSIS — M48061 Spinal stenosis, lumbar region without neurogenic claudication: Secondary | ICD-10-CM | POA: Diagnosis not present

## 2012-10-23 DIAGNOSIS — M542 Cervicalgia: Secondary | ICD-10-CM | POA: Diagnosis not present

## 2012-10-23 DIAGNOSIS — G4489 Other headache syndrome: Secondary | ICD-10-CM | POA: Diagnosis not present

## 2012-10-25 ENCOUNTER — Other Ambulatory Visit: Payer: Self-pay | Admitting: Neurology

## 2012-10-25 DIAGNOSIS — R519 Headache, unspecified: Secondary | ICD-10-CM

## 2012-10-25 DIAGNOSIS — R51 Headache: Secondary | ICD-10-CM | POA: Diagnosis not present

## 2012-10-25 LAB — CREATININE, SERUM: Creat: 1.2 mg/dL — ABNORMAL HIGH (ref 0.50–1.10)

## 2012-10-28 ENCOUNTER — Other Ambulatory Visit: Payer: Medicare Other

## 2012-10-29 DIAGNOSIS — M949 Disorder of cartilage, unspecified: Secondary | ICD-10-CM | POA: Diagnosis not present

## 2012-10-29 DIAGNOSIS — M899 Disorder of bone, unspecified: Secondary | ICD-10-CM | POA: Diagnosis not present

## 2012-11-05 ENCOUNTER — Ambulatory Visit
Admission: RE | Admit: 2012-11-05 | Discharge: 2012-11-05 | Disposition: A | Discharge status: Home / Self Care | Payer: Medicare Other | Source: Ambulatory Visit | Attending: Neurology | Admitting: Neurology

## 2012-11-05 DIAGNOSIS — R519 Headache, unspecified: Secondary | ICD-10-CM

## 2012-11-05 DIAGNOSIS — R51 Headache: Secondary | ICD-10-CM | POA: Diagnosis not present

## 2012-11-05 MED ORDER — GADOBENATE DIMEGLUMINE 529 MG/ML IV SOLN
15.0000 mL | Freq: Once | INTRAVENOUS | Status: AC | PRN
  Administered 2012-11-05: 15 mL via INTRAVENOUS

## 2012-12-05 DIAGNOSIS — G4489 Other headache syndrome: Secondary | ICD-10-CM | POA: Diagnosis not present

## 2012-12-05 DIAGNOSIS — R930 Abnormal findings on diagnostic imaging of skull and head, not elsewhere classified: Secondary | ICD-10-CM | POA: Diagnosis not present

## 2013-01-01 ENCOUNTER — Other Ambulatory Visit: Payer: Self-pay | Admitting: Family Medicine

## 2013-01-01 DIAGNOSIS — R599 Enlarged lymph nodes, unspecified: Secondary | ICD-10-CM | POA: Diagnosis not present

## 2013-01-07 ENCOUNTER — Ambulatory Visit
Admission: RE | Admit: 2013-01-07 | Discharge: 2013-01-07 | Disposition: A | Discharge status: Home / Self Care | Payer: Medicare Other | Source: Ambulatory Visit | Attending: Family Medicine | Admitting: Family Medicine

## 2013-01-07 DIAGNOSIS — R220 Localized swelling, mass and lump, head: Secondary | ICD-10-CM | POA: Diagnosis not present

## 2013-01-07 DIAGNOSIS — R599 Enlarged lymph nodes, unspecified: Secondary | ICD-10-CM

## 2013-01-07 MED ORDER — IOHEXOL 300 MG/ML  SOLN
75.0000 mL | Freq: Once | INTRAMUSCULAR | Status: AC | PRN
  Administered 2013-01-07: 75 mL via INTRAVENOUS

## 2013-01-31 ENCOUNTER — Encounter (HOSPITAL_COMMUNITY): Payer: Self-pay | Admitting: Emergency Medicine

## 2013-01-31 ENCOUNTER — Emergency Department (HOSPITAL_COMMUNITY)
Admission: EM | Admit: 2013-01-31 | Discharge: 2013-01-31 | Disposition: A | Discharge status: Home / Self Care | Payer: Medicare Other | Attending: Emergency Medicine | Admitting: Emergency Medicine

## 2013-01-31 ENCOUNTER — Emergency Department (HOSPITAL_COMMUNITY): Payer: Medicare Other

## 2013-01-31 DIAGNOSIS — K219 Gastro-esophageal reflux disease without esophagitis: Secondary | ICD-10-CM | POA: Insufficient documentation

## 2013-01-31 DIAGNOSIS — R079 Chest pain, unspecified: Secondary | ICD-10-CM | POA: Diagnosis not present

## 2013-01-31 DIAGNOSIS — Z79899 Other long term (current) drug therapy: Secondary | ICD-10-CM | POA: Insufficient documentation

## 2013-01-31 DIAGNOSIS — Z7982 Long term (current) use of aspirin: Secondary | ICD-10-CM | POA: Insufficient documentation

## 2013-01-31 LAB — BASIC METABOLIC PANEL
BUN: 12 mg/dL (ref 6–23)
Calcium: 9 mg/dL (ref 8.4–10.5)
Creatinine, Ser: 0.94 mg/dL (ref 0.50–1.10)
GFR calc Af Amer: 69 mL/min — ABNORMAL LOW (ref >90)
GFR calc non Af Amer: 60 mL/min — ABNORMAL LOW (ref >90)
Potassium: 4.1 mEq/L (ref 3.5–5.1)
Sodium: 141 mEq/L (ref 135–145)

## 2013-01-31 LAB — CBC
HCT: 43.6 % (ref 36.0–46.0)
MCHC: 33.9 g/dL (ref 30.0–36.0)
MCV: 88.4 fL (ref 78.0–100.0)
RDW: 12.8 % (ref 11.5–15.5)

## 2013-01-31 MED ORDER — NITROGLYCERIN 0.4 MG SL SUBL
0.4000 mg | SUBLINGUAL_TABLET | Freq: Once | SUBLINGUAL | Status: DC
  Filled 2013-01-31: qty 25

## 2013-01-31 NOTE — ED Provider Notes (Signed)
CSN: [id number]     Arrival date & time 01/31/13  1244 History   First MD Initiated Contact with Patient 01/31/13 1321     Chief Complaint  Patient presents with  . Chest Pain   (Consider location/radiation/quality/duration/timing/severity/associated sxs/prior Treatment) Patient is a 72 y.o. female presenting with chest pain. The history is provided by the patient.  Chest Pain Associated symptoms: no abdominal pain, no back pain, no headache, no nausea, no numbness, no shortness of breath, not vomiting and no weakness    patient with chest pain. Has had episodes for last 3 days. Come on randomly. One is with walking with the other 2 were addressed. It does not come on with exertion necessarily. It is on left side of her chest. It is not worse with breathing or movement. It is similar pain she's had previously. She does not smoke. She's had previous negative stress tests and echocardiograms  Past Medical History  Diagnosis Date  . Tortuous colon   . Chronic constipation   . GERD (gastroesophageal reflux disease)     with esophagitis followed by Dr. Matthias Hughs  . Chest pain     Myoview 5/11 with no ischemic or infarction   Past Surgical History  Procedure Laterality Date  . Total abdominal hysterectomy w/ bilateral salpingoophorectomy    . Breast surgery      tumor removal, benign x2  . Appendectomy    . Esophageal dilation  2008    Dr. Matthias Hughs   Family History  Problem Relation Age of Onset  . Sudden death Father     drowning  . Pulmonary embolism Mother   . Other Brother     mesothelioma  . Hypertension Sister     x3  . Stroke Maternal Grandmother    History  Substance Use Topics  . Smoking status: Never Smoker   . Smokeless tobacco: Not on file  . Alcohol Use: No   OB History   Grav Para Term Preterm Abortions TAB SAB Ect Mult Living                 Review of Systems  Constitutional: Negative for activity change and appetite change.  Eyes: Negative for pain.   Respiratory: Negative for chest tightness and shortness of breath.   Cardiovascular: Positive for chest pain. Negative for leg swelling.  Gastrointestinal: Negative for nausea, vomiting, abdominal pain and diarrhea.  Genitourinary: Negative for flank pain.  Musculoskeletal: Negative for back pain and neck stiffness.  Skin: Negative for rash.  Neurological: Negative for weakness, numbness and headaches.  Psychiatric/Behavioral: Negative for behavioral problems.    Allergies  Review of patient's allergies indicates no known allergies.  Home Medications   Current Outpatient Rx  Name  Route  Sig  Dispense  Refill  . Ascorbic Acid (VITAMIN C) 500 MG CHEW   Oral   Chew 500 mg by mouth daily.          Marland Kitchen aspirin EC 81 MG tablet   Oral   Take 81 mg by mouth every other day.    150 tablet   2   . Cholecalciferol (VITAMIN D PO)   Oral   Take 1 tablet by mouth daily.          . Cyanocobalamin (VITAMIN B-12 PO)   Oral   Take 1 tablet by mouth daily.          . fish oil-omega-3 fatty acids 1000 MG capsule   Oral   Take 2 g by  mouth daily.         . Magnesium Oxide 500 MG TABS   Oral   Take 500 mg by mouth daily.         . Pyridoxine HCl (VITAMIN B-6 PO)   Oral   Take 1 tablet by mouth daily.          . vitamin E 1000 UNIT capsule   Oral   Take 1,000 Units by mouth daily.            BP 156/82  Pulse 64  Temp(Src) 97.6 F (36.4 C) (Oral)  Resp 22  Ht 5\' 3"  (1.6 m)  Wt 170 lb 12.8 oz (77.474 kg)  BMI 30.26 kg/m2  SpO2 100% Physical Exam  Nursing note and vitals reviewed. Constitutional: She is oriented to person, place, and time. She appears well-developed and well-nourished.  HENT:  Head: Normocephalic and atraumatic.  Eyes: EOM are normal. Pupils are equal, round, and reactive to light.  Neck: Normal range of motion. Neck supple.  Cardiovascular: Normal rate, regular rhythm and normal heart sounds.   No murmur heard. Pulmonary/Chest: Effort  normal and breath sounds normal. No respiratory distress. She has no wheezes. She has no rales. She exhibits no tenderness.  Abdominal: Soft. Bowel sounds are normal. She exhibits no distension. There is no tenderness. There is no rebound and no guarding.  Musculoskeletal: Normal range of motion.  Neurological: She is alert and oriented to person, place, and time. No cranial nerve deficit.  Skin: Skin is warm and dry. No rash noted.  Psychiatric: She has a normal mood and affect. Her speech is normal.    ED Course  Procedures (including critical care time) Labs Review Labs Reviewed  BASIC METABOLIC PANEL - Abnormal; Notable for the following:    GFR calc non Af Amer 60 (*)    GFR calc Af Amer 69 (*)    All other components within normal limits  CBC  POCT I-STAT TROPONIN I   Imaging Review Dg Chest 2 View  01/31/2013   CLINICAL DATA:  Chest pain.  EXAM: CHEST  2 VIEW  COMPARISON:  PA and lateral chest 03/18/2012.  FINDINGS: The lungs are clear. Heart size is normal. No pneumothorax or pleural fluid. No focal bony abnormality.  IMPRESSION: Negative chest.   Electronically Signed   By: Drusilla Kanner M.D.   On: 01/31/2013 14:04    EKG Interpretation     Ventricular Rate:  65 PR Interval:  182 QRS Duration: 86 QT Interval:  394 QTC Calculation: 409 R Axis:   70 Text Interpretation:  Normal sinus rhythm Normal ECG            MDM   1. Chest pain    She presents with chest pain. Previous workup for same. Has a cardiologist. EKG reassuring. Troponin negative. Will discharge home. Patient was able to ambulate without chest pain. Doubt cardiac cause or pulmonary embolism. Will discharge home    Juliet Rude. Rubin Payor, MD 01/31/13 [phone]

## 2013-01-31 NOTE — ED Notes (Signed)
Patient transported to X-ray 

## 2013-01-31 NOTE — ED Notes (Signed)
Was not able to get the CBC or BMP called lab

## 2013-01-31 NOTE — ED Notes (Signed)
Pt reports sudden onset of Left side chest pain radiating to her Left shoulder, back pain, weakness, and feeling lightheaded. Pt reports it first started yesterday at 1100 as a sharp shooting pain and now is a dull pain. Pt reports she was bending over to pick something up from the floor when her symptoms started. Pt denies N/V, cough, SOB, or diaphoresis

## 2013-01-31 NOTE — ED Notes (Signed)
Ambulated patient in hallway without difficulty. Pt denies any symptoms. Assisted back on stretcher. Tolerated well.

## 2013-01-31 NOTE — ED Notes (Signed)
MD at bedside. Dr. Pickering. 

## 2013-02-03 DIAGNOSIS — K219 Gastro-esophageal reflux disease without esophagitis: Secondary | ICD-10-CM | POA: Diagnosis not present

## 2013-02-05 DIAGNOSIS — J029 Acute pharyngitis, unspecified: Secondary | ICD-10-CM | POA: Diagnosis not present

## 2013-04-02 ENCOUNTER — Other Ambulatory Visit: Payer: Self-pay | Admitting: Gastroenterology

## 2013-04-02 DIAGNOSIS — K5901 Slow transit constipation: Secondary | ICD-10-CM | POA: Diagnosis not present

## 2013-04-02 DIAGNOSIS — Z800 Family history of malignant neoplasm of digestive organs: Secondary | ICD-10-CM | POA: Diagnosis not present

## 2013-04-02 DIAGNOSIS — R110 Nausea: Secondary | ICD-10-CM | POA: Diagnosis not present

## 2013-04-02 DIAGNOSIS — R1011 Right upper quadrant pain: Secondary | ICD-10-CM

## 2013-04-07 ENCOUNTER — Ambulatory Visit
Admission: RE | Admit: 2013-04-07 | Discharge: 2013-04-07 | Disposition: A | Discharge status: Home / Self Care | Payer: Medicare Other | Source: Ambulatory Visit | Attending: Gastroenterology | Admitting: Gastroenterology

## 2013-04-07 DIAGNOSIS — R1011 Right upper quadrant pain: Secondary | ICD-10-CM | POA: Diagnosis not present

## 2013-04-30 DIAGNOSIS — Z862 Personal history of diseases of the blood and blood-forming organs and certain disorders involving the immune mechanism: Secondary | ICD-10-CM | POA: Diagnosis not present

## 2013-04-30 DIAGNOSIS — R002 Palpitations: Secondary | ICD-10-CM | POA: Diagnosis not present

## 2013-04-30 DIAGNOSIS — R079 Chest pain, unspecified: Secondary | ICD-10-CM | POA: Diagnosis not present

## 2013-04-30 DIAGNOSIS — E559 Vitamin D deficiency, unspecified: Secondary | ICD-10-CM | POA: Diagnosis not present

## 2013-04-30 DIAGNOSIS — Z8639 Personal history of other endocrine, nutritional and metabolic disease: Secondary | ICD-10-CM | POA: Diagnosis not present

## 2013-05-09 ENCOUNTER — Encounter: Payer: Self-pay | Admitting: Cardiology

## 2013-05-09 DIAGNOSIS — R002 Palpitations: Secondary | ICD-10-CM | POA: Diagnosis not present

## 2013-05-09 DIAGNOSIS — R0789 Other chest pain: Secondary | ICD-10-CM | POA: Diagnosis not present

## 2013-05-09 DIAGNOSIS — K219 Gastro-esophageal reflux disease without esophagitis: Secondary | ICD-10-CM | POA: Diagnosis not present

## 2013-06-04 DIAGNOSIS — R0789 Other chest pain: Secondary | ICD-10-CM | POA: Diagnosis not present

## 2013-06-09 DIAGNOSIS — K3189 Other diseases of stomach and duodenum: Secondary | ICD-10-CM | POA: Diagnosis not present

## 2013-06-09 DIAGNOSIS — Z800 Family history of malignant neoplasm of digestive organs: Secondary | ICD-10-CM | POA: Diagnosis not present

## 2013-06-09 DIAGNOSIS — R1013 Epigastric pain: Secondary | ICD-10-CM | POA: Diagnosis not present

## 2013-07-07 DIAGNOSIS — M549 Dorsalgia, unspecified: Secondary | ICD-10-CM | POA: Diagnosis not present

## 2013-07-30 DIAGNOSIS — Z800 Family history of malignant neoplasm of digestive organs: Secondary | ICD-10-CM | POA: Diagnosis not present

## 2013-07-30 DIAGNOSIS — K5730 Diverticulosis of large intestine without perforation or abscess without bleeding: Secondary | ICD-10-CM | POA: Diagnosis not present

## 2013-07-30 DIAGNOSIS — Z1211 Encounter for screening for malignant neoplasm of colon: Secondary | ICD-10-CM | POA: Diagnosis not present

## 2013-07-30 DIAGNOSIS — R1011 Right upper quadrant pain: Secondary | ICD-10-CM | POA: Diagnosis not present

## 2013-07-30 DIAGNOSIS — K222 Esophageal obstruction: Secondary | ICD-10-CM | POA: Diagnosis not present

## 2013-07-30 DIAGNOSIS — D1330 Benign neoplasm of unspecified part of small intestine: Secondary | ICD-10-CM | POA: Diagnosis not present

## 2013-07-30 DIAGNOSIS — R110 Nausea: Secondary | ICD-10-CM | POA: Diagnosis not present

## 2013-07-30 DIAGNOSIS — D129 Benign neoplasm of anus and anal canal: Secondary | ICD-10-CM | POA: Diagnosis not present

## 2013-07-30 DIAGNOSIS — D126 Benign neoplasm of colon, unspecified: Secondary | ICD-10-CM | POA: Diagnosis not present

## 2013-07-30 DIAGNOSIS — D128 Benign neoplasm of rectum: Secondary | ICD-10-CM | POA: Diagnosis not present

## 2013-08-04 DIAGNOSIS — R609 Edema, unspecified: Secondary | ICD-10-CM | POA: Diagnosis not present

## 2013-08-04 DIAGNOSIS — M549 Dorsalgia, unspecified: Secondary | ICD-10-CM | POA: Diagnosis not present

## 2013-08-25 DIAGNOSIS — K219 Gastro-esophageal reflux disease without esophagitis: Secondary | ICD-10-CM | POA: Diagnosis not present

## 2013-08-25 DIAGNOSIS — K5901 Slow transit constipation: Secondary | ICD-10-CM | POA: Diagnosis not present

## 2013-08-25 DIAGNOSIS — R1013 Epigastric pain: Secondary | ICD-10-CM | POA: Diagnosis not present

## 2013-09-26 DIAGNOSIS — Z1331 Encounter for screening for depression: Secondary | ICD-10-CM | POA: Diagnosis not present

## 2013-09-26 DIAGNOSIS — M949 Disorder of cartilage, unspecified: Secondary | ICD-10-CM | POA: Diagnosis not present

## 2013-09-26 DIAGNOSIS — M899 Disorder of bone, unspecified: Secondary | ICD-10-CM | POA: Diagnosis not present

## 2013-09-26 DIAGNOSIS — Z0000 Encounter for general adult medical examination without abnormal findings: Secondary | ICD-10-CM | POA: Diagnosis not present

## 2013-09-26 DIAGNOSIS — E559 Vitamin D deficiency, unspecified: Secondary | ICD-10-CM | POA: Diagnosis not present

## 2013-09-29 DIAGNOSIS — Z1231 Encounter for screening mammogram for malignant neoplasm of breast: Secondary | ICD-10-CM | POA: Diagnosis not present

## 2013-11-20 DIAGNOSIS — R5381 Other malaise: Secondary | ICD-10-CM | POA: Diagnosis not present

## 2013-11-20 DIAGNOSIS — L723 Sebaceous cyst: Secondary | ICD-10-CM | POA: Diagnosis not present

## 2013-11-20 DIAGNOSIS — E559 Vitamin D deficiency, unspecified: Secondary | ICD-10-CM | POA: Diagnosis not present

## 2013-11-20 DIAGNOSIS — R609 Edema, unspecified: Secondary | ICD-10-CM | POA: Diagnosis not present

## 2013-12-25 DIAGNOSIS — R002 Palpitations: Secondary | ICD-10-CM | POA: Diagnosis not present

## 2013-12-25 DIAGNOSIS — K219 Gastro-esophageal reflux disease without esophagitis: Secondary | ICD-10-CM | POA: Diagnosis not present

## 2013-12-25 DIAGNOSIS — R42 Dizziness and giddiness: Secondary | ICD-10-CM | POA: Diagnosis not present

## 2013-12-25 DIAGNOSIS — R0789 Other chest pain: Secondary | ICD-10-CM | POA: Diagnosis not present

## 2013-12-26 DIAGNOSIS — R3989 Other symptoms and signs involving the genitourinary system: Secondary | ICD-10-CM | POA: Diagnosis not present

## 2014-01-06 ENCOUNTER — Ambulatory Visit (INDEPENDENT_AMBULATORY_CARE_PROVIDER_SITE_OTHER): Payer: Medicare Other | Admitting: Neurology

## 2014-01-06 ENCOUNTER — Encounter (INDEPENDENT_AMBULATORY_CARE_PROVIDER_SITE_OTHER): Payer: Self-pay

## 2014-01-06 ENCOUNTER — Encounter: Payer: Self-pay | Admitting: Neurology

## 2014-01-06 VITALS — BP 112/71 | HR 67 | Ht 63.0 in | Wt 170.2 lb

## 2014-01-06 DIAGNOSIS — R51 Headache: Secondary | ICD-10-CM

## 2014-01-06 DIAGNOSIS — G441 Vascular headache, not elsewhere classified: Secondary | ICD-10-CM | POA: Diagnosis not present

## 2014-01-06 DIAGNOSIS — R519 Headache, unspecified: Secondary | ICD-10-CM | POA: Insufficient documentation

## 2014-01-06 LAB — SEDIMENTATION RATE: Sed Rate: 5 mm/hr (ref 0–40)

## 2014-01-06 MED ORDER — TOPIRAMATE 25 MG PO TABS: ORAL_TABLET | ORAL | Status: DC

## 2014-01-06 NOTE — Patient Instructions (Signed)
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
## 2014-01-08 NOTE — Progress Notes (Signed)
Quick Note:  Left message that blood work results were unremarkable, per Dr. Jannifer Franklin. ______

## 2014-01-14 ENCOUNTER — Other Ambulatory Visit: Payer: BC Managed Care – HMO

## 2014-01-26 ENCOUNTER — Other Ambulatory Visit: Payer: Medicare Other

## 2014-01-28 ENCOUNTER — Other Ambulatory Visit: Payer: Medicare Other

## 2014-02-04 ENCOUNTER — Ambulatory Visit (INDEPENDENT_AMBULATORY_CARE_PROVIDER_SITE_OTHER): Payer: Medicare Other

## 2014-02-04 ENCOUNTER — Encounter: Payer: Self-pay | Admitting: Neurology

## 2014-02-04 DIAGNOSIS — G441 Vascular headache, not elsewhere classified: Secondary | ICD-10-CM | POA: Diagnosis not present

## 2014-02-06 ENCOUNTER — Telehealth: Payer: Self-pay | Admitting: Neurology

## 2014-02-06 NOTE — Telephone Encounter (Signed)
I called the patient. The carotid doppler study is OK. I discussed this with her. MRI of the brain is ok.

## 2014-02-09 ENCOUNTER — Telehealth: Payer: Self-pay | Admitting: Neurology

## 2014-02-09 ENCOUNTER — Ambulatory Visit
Admission: RE | Admit: 2014-02-09 | Discharge: 2014-02-09 | Disposition: A | Discharge status: Home / Self Care | Payer: Medicare Other | Source: Ambulatory Visit | Attending: Neurology | Admitting: Neurology

## 2014-02-09 DIAGNOSIS — G441 Vascular headache, not elsewhere classified: Secondary | ICD-10-CM

## 2014-02-09 NOTE — Telephone Encounter (Signed)
  I called the patient. The MRI the brain shows no change from over a year ago. The patient indicates that her headaches have improved some, she will have occasional brief episode of dizziness. She is on Topamax.  MRI brain 02/09/2014:  IMPRESSION:  Abnormal MRI brain (without) demonstrating: 1. Mild periventricular, subcortical and pontine chronic small vessel ischemic disease. 2. No acute findings.  3. No change from MRI on 11/05/12.

## 2014-02-10 ENCOUNTER — Encounter: Payer: Self-pay | Admitting: Neurology

## 2014-02-18 DIAGNOSIS — N952 Postmenopausal atrophic vaginitis: Secondary | ICD-10-CM | POA: Diagnosis not present

## 2014-02-18 DIAGNOSIS — N390 Urinary tract infection, site not specified: Secondary | ICD-10-CM | POA: Diagnosis not present

## 2014-02-18 DIAGNOSIS — Z780 Asymptomatic menopausal state: Secondary | ICD-10-CM | POA: Diagnosis not present

## 2014-02-24 DIAGNOSIS — N390 Urinary tract infection, site not specified: Secondary | ICD-10-CM | POA: Diagnosis not present

## 2014-03-10 ENCOUNTER — Ambulatory Visit
Admission: RE | Admit: 2014-03-10 | Discharge: 2014-03-10 | Disposition: A | Discharge status: Home / Self Care | Payer: Medicare Other | Source: Ambulatory Visit | Attending: Cardiology | Admitting: Cardiology

## 2014-03-10 ENCOUNTER — Other Ambulatory Visit: Payer: Self-pay | Admitting: Cardiology

## 2014-03-10 DIAGNOSIS — R002 Palpitations: Secondary | ICD-10-CM | POA: Diagnosis not present

## 2014-03-10 DIAGNOSIS — R42 Dizziness and giddiness: Secondary | ICD-10-CM | POA: Diagnosis not present

## 2014-03-10 DIAGNOSIS — K219 Gastro-esophageal reflux disease without esophagitis: Secondary | ICD-10-CM | POA: Diagnosis not present

## 2014-03-10 DIAGNOSIS — R0789 Other chest pain: Secondary | ICD-10-CM | POA: Diagnosis not present

## 2014-03-10 DIAGNOSIS — R0602 Shortness of breath: Secondary | ICD-10-CM | POA: Diagnosis not present

## 2014-03-10 DIAGNOSIS — R079 Chest pain, unspecified: Secondary | ICD-10-CM | POA: Diagnosis not present

## 2014-04-08 DIAGNOSIS — N8110 Cystocele, unspecified: Secondary | ICD-10-CM | POA: Diagnosis not present

## 2014-04-08 DIAGNOSIS — N760 Acute vaginitis: Secondary | ICD-10-CM | POA: Diagnosis not present

## 2014-04-08 DIAGNOSIS — N952 Postmenopausal atrophic vaginitis: Secondary | ICD-10-CM | POA: Diagnosis not present

## 2014-04-08 DIAGNOSIS — N3010 Interstitial cystitis (chronic) without hematuria: Secondary | ICD-10-CM | POA: Diagnosis not present

## 2014-04-21 ENCOUNTER — Ambulatory Visit: Payer: BC Managed Care – HMO | Admitting: Neurology

## 2014-05-18 DIAGNOSIS — M549 Dorsalgia, unspecified: Secondary | ICD-10-CM | POA: Diagnosis not present

## 2014-05-22 DIAGNOSIS — N952 Postmenopausal atrophic vaginitis: Secondary | ICD-10-CM | POA: Diagnosis not present

## 2014-05-22 DIAGNOSIS — R102 Pelvic and perineal pain: Secondary | ICD-10-CM | POA: Diagnosis not present

## 2014-05-22 DIAGNOSIS — N3010 Interstitial cystitis (chronic) without hematuria: Secondary | ICD-10-CM | POA: Diagnosis not present

## 2014-06-30 DIAGNOSIS — K5901 Slow transit constipation: Secondary | ICD-10-CM | POA: Diagnosis not present

## 2014-06-30 DIAGNOSIS — R1013 Epigastric pain: Secondary | ICD-10-CM | POA: Diagnosis not present

## 2014-10-01 DIAGNOSIS — M8588 Other specified disorders of bone density and structure, other site: Secondary | ICD-10-CM | POA: Diagnosis not present

## 2014-10-01 DIAGNOSIS — E559 Vitamin D deficiency, unspecified: Secondary | ICD-10-CM | POA: Diagnosis not present

## 2014-10-01 DIAGNOSIS — Z1389 Encounter for screening for other disorder: Secondary | ICD-10-CM | POA: Diagnosis not present

## 2014-10-01 DIAGNOSIS — R5383 Other fatigue: Secondary | ICD-10-CM | POA: Diagnosis not present

## 2014-10-01 DIAGNOSIS — Z0000 Encounter for general adult medical examination without abnormal findings: Secondary | ICD-10-CM | POA: Diagnosis not present

## 2014-10-28 DIAGNOSIS — Z1231 Encounter for screening mammogram for malignant neoplasm of breast: Secondary | ICD-10-CM | POA: Diagnosis not present

## 2014-11-04 DIAGNOSIS — M859 Disorder of bone density and structure, unspecified: Secondary | ICD-10-CM | POA: Diagnosis not present

## 2014-11-04 DIAGNOSIS — M8589 Other specified disorders of bone density and structure, multiple sites: Secondary | ICD-10-CM | POA: Diagnosis not present

## 2014-11-11 DIAGNOSIS — K5900 Constipation, unspecified: Secondary | ICD-10-CM | POA: Diagnosis not present

## 2014-11-11 DIAGNOSIS — K602 Anal fissure, unspecified: Secondary | ICD-10-CM | POA: Diagnosis not present

## 2014-12-25 ENCOUNTER — Other Ambulatory Visit: Payer: Self-pay | Admitting: Cardiology

## 2014-12-25 ENCOUNTER — Ambulatory Visit
Admission: RE | Admit: 2014-12-25 | Discharge: 2014-12-25 | Disposition: A | Discharge status: Home / Self Care | Payer: Medicare Other | Source: Ambulatory Visit | Attending: Cardiology | Admitting: Cardiology

## 2014-12-25 DIAGNOSIS — K219 Gastro-esophageal reflux disease without esophagitis: Secondary | ICD-10-CM | POA: Diagnosis not present

## 2014-12-25 DIAGNOSIS — R0789 Other chest pain: Secondary | ICD-10-CM

## 2014-12-25 DIAGNOSIS — R0602 Shortness of breath: Secondary | ICD-10-CM | POA: Diagnosis not present

## 2014-12-25 DIAGNOSIS — R079 Chest pain, unspecified: Secondary | ICD-10-CM | POA: Diagnosis not present

## 2015-01-14 DIAGNOSIS — R0602 Shortness of breath: Secondary | ICD-10-CM | POA: Diagnosis not present

## 2015-01-14 DIAGNOSIS — R079 Chest pain, unspecified: Secondary | ICD-10-CM | POA: Diagnosis not present

## 2015-01-14 DIAGNOSIS — K219 Gastro-esophageal reflux disease without esophagitis: Secondary | ICD-10-CM | POA: Diagnosis not present

## 2015-01-14 DIAGNOSIS — R0789 Other chest pain: Secondary | ICD-10-CM | POA: Diagnosis not present

## 2015-01-20 DIAGNOSIS — K5900 Constipation, unspecified: Secondary | ICD-10-CM | POA: Diagnosis not present

## 2015-01-20 DIAGNOSIS — R109 Unspecified abdominal pain: Secondary | ICD-10-CM | POA: Diagnosis not present

## 2015-01-20 DIAGNOSIS — R110 Nausea: Secondary | ICD-10-CM | POA: Diagnosis not present

## 2015-02-02 DIAGNOSIS — H538 Other visual disturbances: Secondary | ICD-10-CM | POA: Diagnosis not present

## 2015-02-12 IMAGING — CR DG CHEST 2V
2 series · 2 of 2 positions shown · non-contrast
Comparison: PA and lateral chest of November 20, 2013

CLINICAL DATA: One month history of shortness of breath and chest
pain.

EXAM:
CHEST  2 VIEW

[w chest pa]
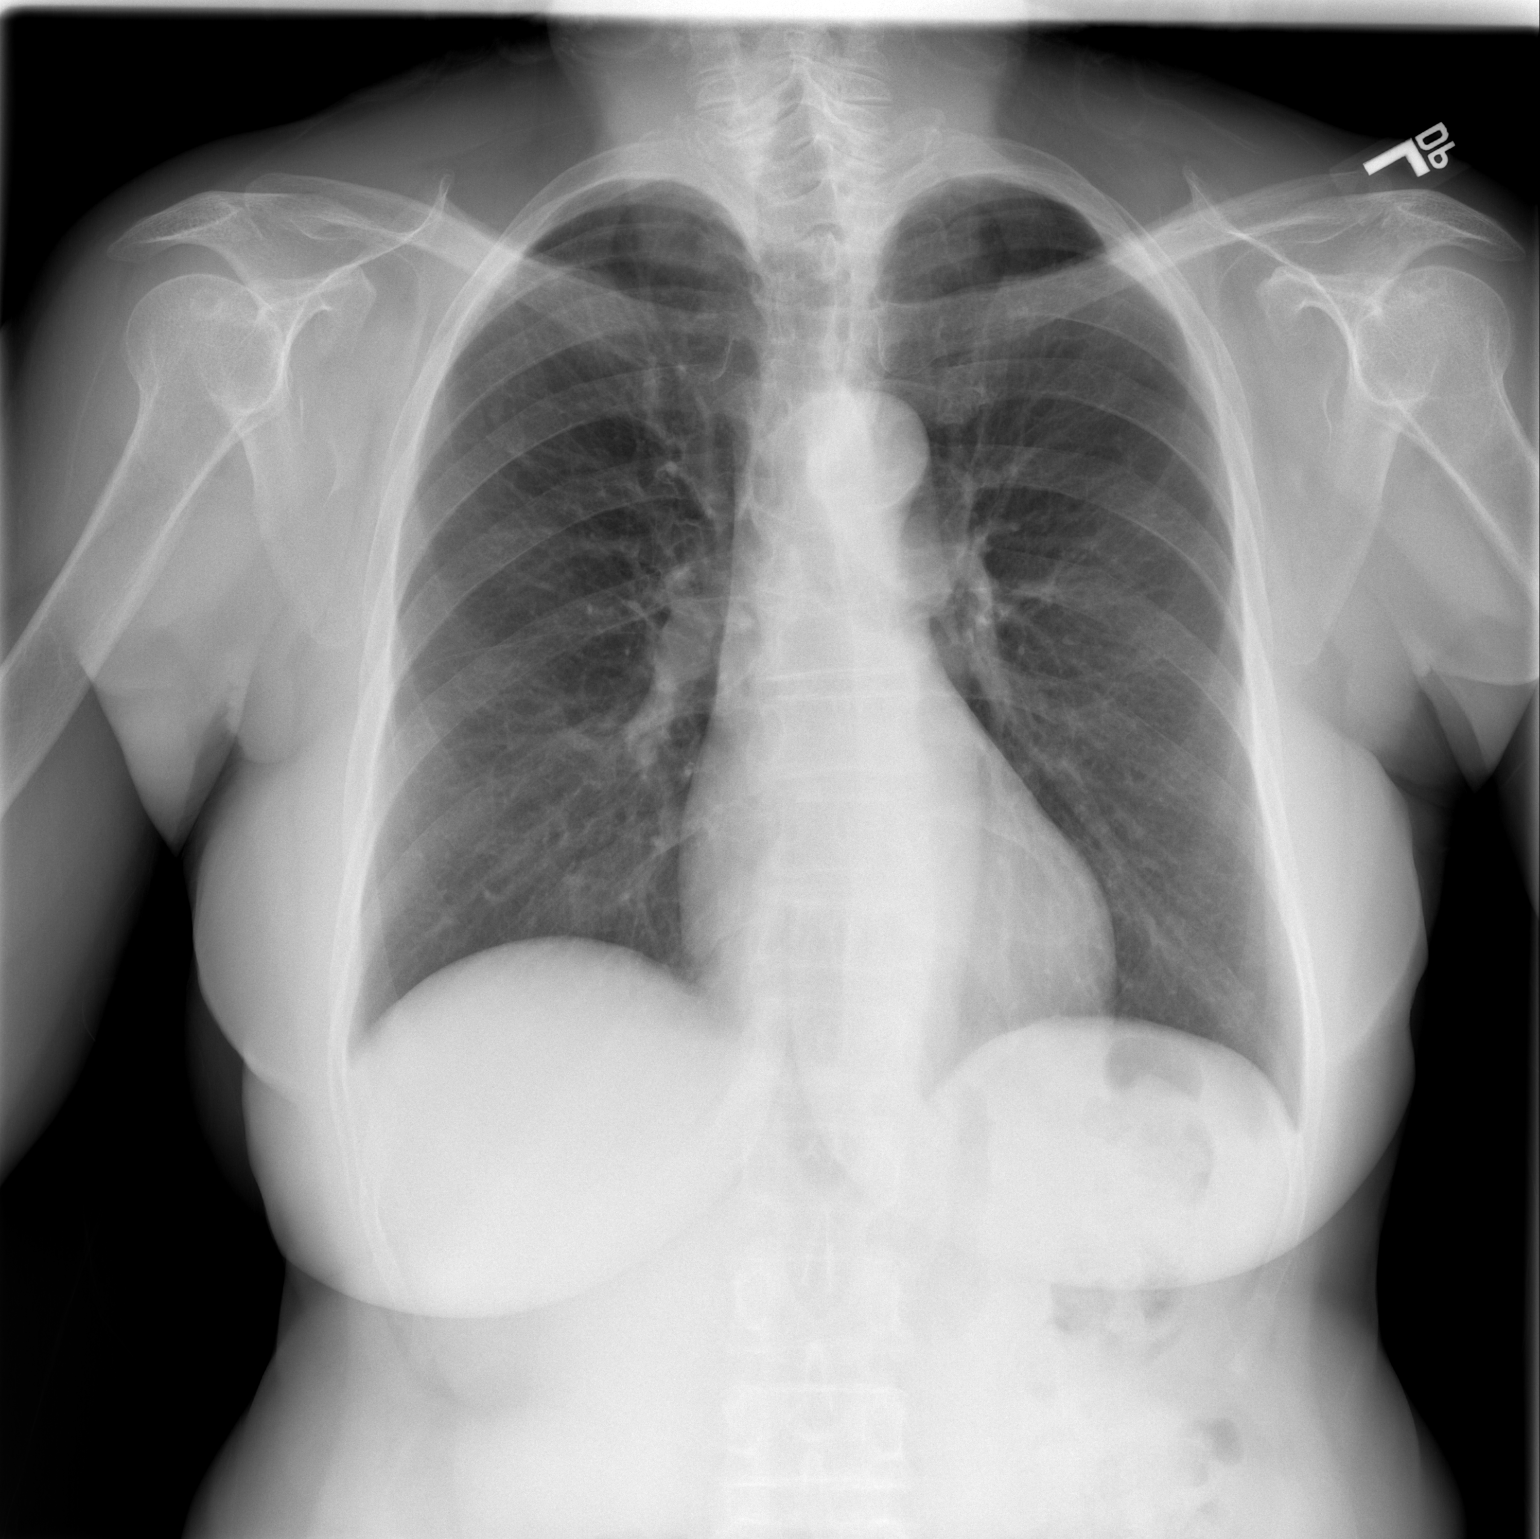

[w chest lat]
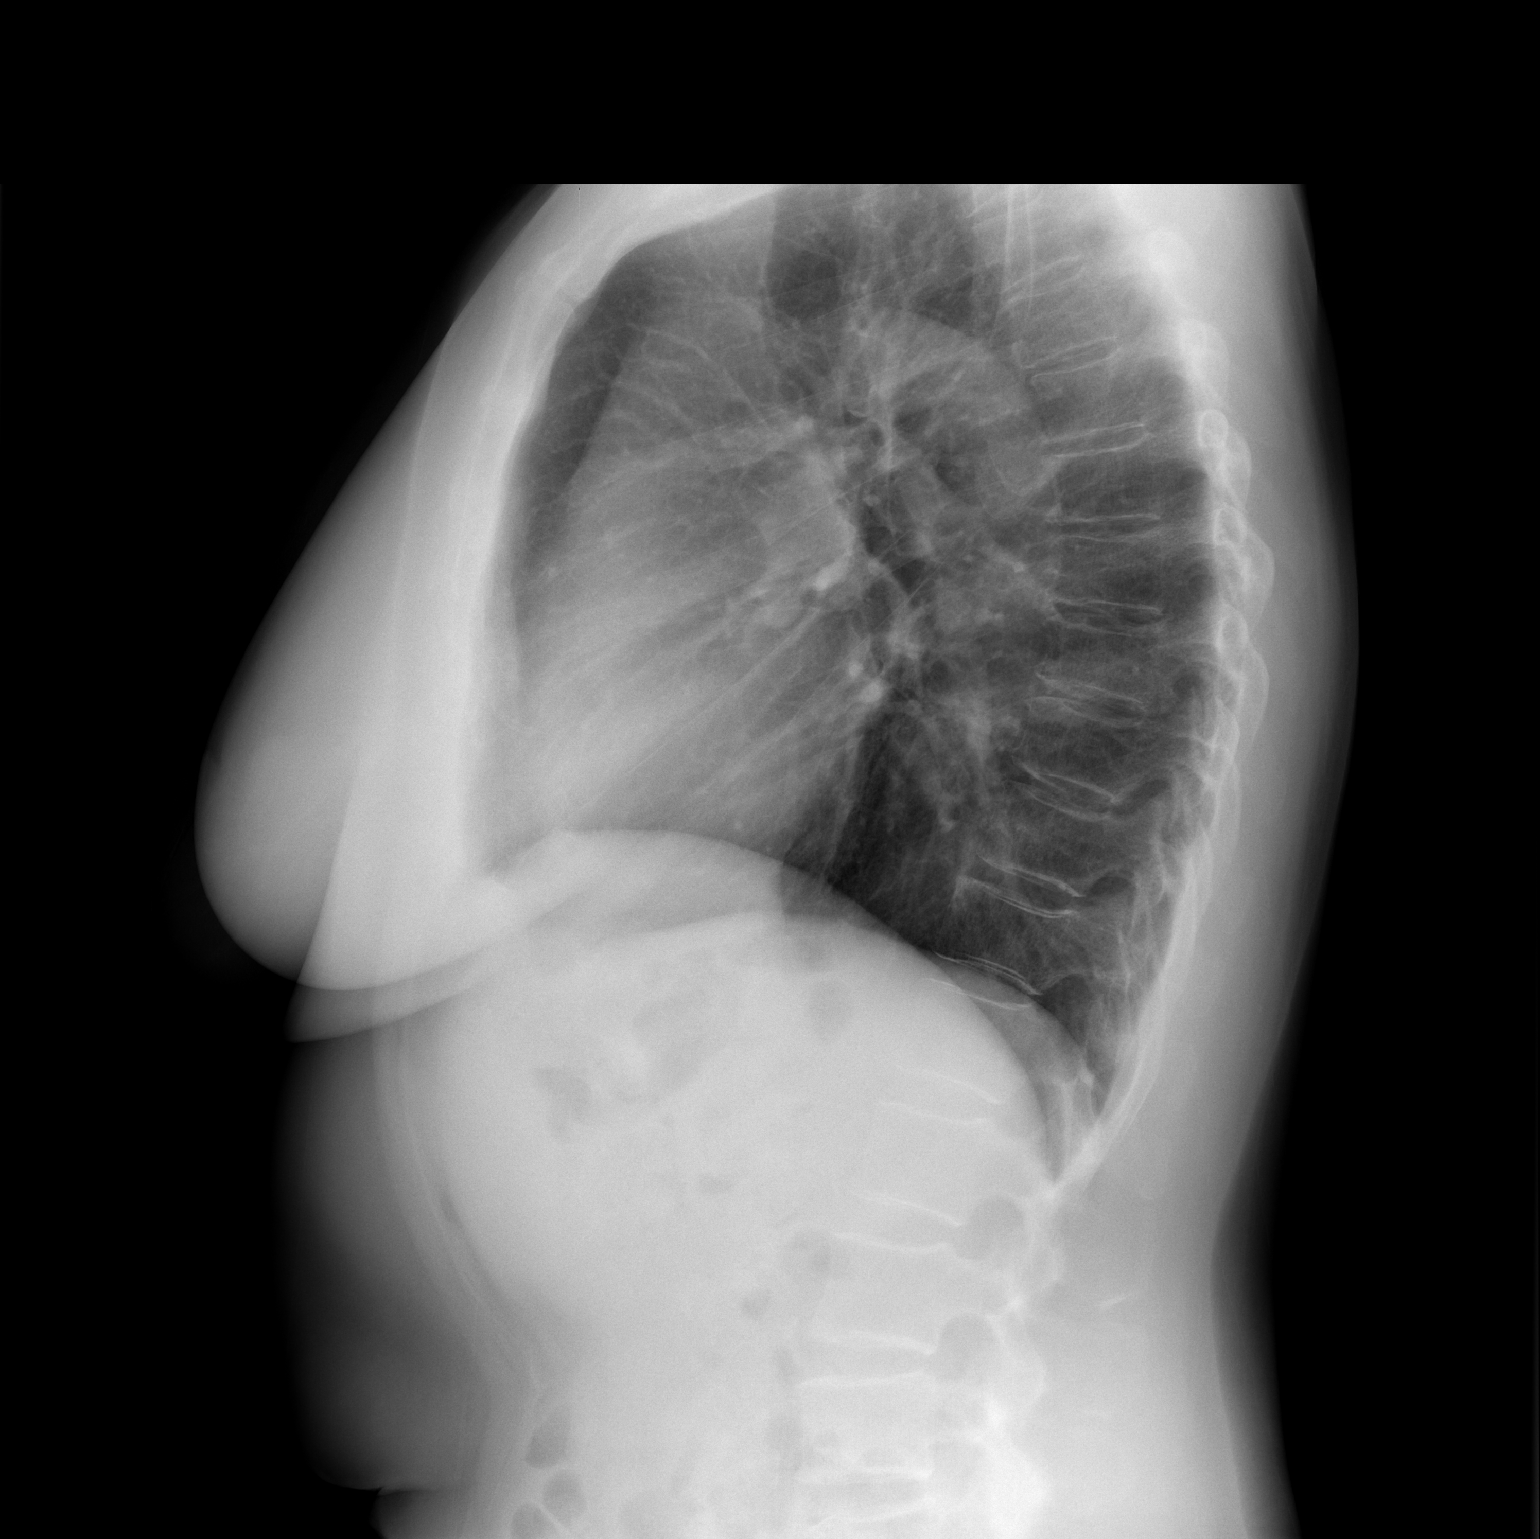

[2 of 2 positions shown; findings below may reference images not displayed]

FINDINGS: The lungs remain mildly hyperinflated. There is no focal infiltrate.
There is no pleural effusion or pneumothorax. The heart and
pulmonary vascularity are unremarkable. The mediastinum is normal in
width. The bony structures are normal.
IMPRESSION: There is no evidence of pneumonia nor CHF. Mild hyperinflation may
be voluntary or could reflect chronic bronchitic change.

## 2015-02-15 ENCOUNTER — Ambulatory Visit
Admission: RE | Admit: 2015-02-15 | Discharge: 2015-02-15 | Disposition: A | Discharge status: Home / Self Care | Payer: Medicare Other | Source: Ambulatory Visit | Attending: Physician Assistant | Admitting: Physician Assistant

## 2015-02-15 ENCOUNTER — Other Ambulatory Visit: Payer: Self-pay | Admitting: Physician Assistant

## 2015-02-15 DIAGNOSIS — R1010 Upper abdominal pain, unspecified: Secondary | ICD-10-CM | POA: Diagnosis not present

## 2015-02-15 DIAGNOSIS — R109 Unspecified abdominal pain: Secondary | ICD-10-CM

## 2015-02-15 DIAGNOSIS — R1033 Periumbilical pain: Secondary | ICD-10-CM | POA: Diagnosis not present

## 2015-02-19 DIAGNOSIS — K5900 Constipation, unspecified: Secondary | ICD-10-CM | POA: Diagnosis not present

## 2015-05-10 DIAGNOSIS — R51 Headache: Secondary | ICD-10-CM | POA: Diagnosis not present

## 2015-05-28 DIAGNOSIS — K5900 Constipation, unspecified: Secondary | ICD-10-CM | POA: Diagnosis not present

## 2015-05-28 DIAGNOSIS — Z800 Family history of malignant neoplasm of digestive organs: Secondary | ICD-10-CM | POA: Diagnosis not present

## 2015-05-28 DIAGNOSIS — K648 Other hemorrhoids: Secondary | ICD-10-CM | POA: Diagnosis not present

## 2015-06-22 DIAGNOSIS — M79605 Pain in left leg: Secondary | ICD-10-CM | POA: Diagnosis not present

## 2015-07-30 DIAGNOSIS — K469 Unspecified abdominal hernia without obstruction or gangrene: Secondary | ICD-10-CM | POA: Diagnosis not present

## 2015-07-30 DIAGNOSIS — R102 Pelvic and perineal pain: Secondary | ICD-10-CM | POA: Diagnosis not present

## 2015-07-30 DIAGNOSIS — N898 Other specified noninflammatory disorders of vagina: Secondary | ICD-10-CM | POA: Diagnosis not present

## 2015-07-30 DIAGNOSIS — R300 Dysuria: Secondary | ICD-10-CM | POA: Diagnosis not present

## 2015-07-30 DIAGNOSIS — N952 Postmenopausal atrophic vaginitis: Secondary | ICD-10-CM | POA: Diagnosis not present

## 2015-07-30 DIAGNOSIS — N8111 Cystocele, midline: Secondary | ICD-10-CM | POA: Diagnosis not present

## 2015-08-02 DIAGNOSIS — M5442 Lumbago with sciatica, left side: Secondary | ICD-10-CM | POA: Diagnosis not present

## 2015-08-09 ENCOUNTER — Ambulatory Visit (HOSPITAL_COMMUNITY)
Admission: EM | Admit: 2015-08-09 | Discharge: 2015-08-09 | Disposition: A | Discharge status: Home / Self Care | Payer: Medicare Other

## 2015-08-09 ENCOUNTER — Emergency Department (HOSPITAL_COMMUNITY)
Admission: EM | Admit: 2015-08-09 | Discharge: 2015-08-09 | Disposition: A | Discharge status: Home / Self Care | Payer: Medicare Other | Attending: Emergency Medicine | Admitting: Emergency Medicine

## 2015-08-09 ENCOUNTER — Encounter (HOSPITAL_COMMUNITY): Payer: Self-pay | Admitting: Emergency Medicine

## 2015-08-09 DIAGNOSIS — S199XXA Unspecified injury of neck, initial encounter: Secondary | ICD-10-CM | POA: Insufficient documentation

## 2015-08-09 DIAGNOSIS — Y9389 Activity, other specified: Secondary | ICD-10-CM | POA: Diagnosis not present

## 2015-08-09 DIAGNOSIS — Y9289 Other specified places as the place of occurrence of the external cause: Secondary | ICD-10-CM | POA: Diagnosis not present

## 2015-08-09 DIAGNOSIS — S0990XA Unspecified injury of head, initial encounter: Secondary | ICD-10-CM | POA: Insufficient documentation

## 2015-08-09 DIAGNOSIS — Y998 Other external cause status: Secondary | ICD-10-CM | POA: Insufficient documentation

## 2015-08-09 DIAGNOSIS — W01198A Fall on same level from slipping, tripping and stumbling with subsequent striking against other object, initial encounter: Secondary | ICD-10-CM | POA: Insufficient documentation

## 2015-08-09 DIAGNOSIS — K5900 Constipation, unspecified: Secondary | ICD-10-CM | POA: Diagnosis not present

## 2015-08-09 NOTE — ED Notes (Signed)
Pt reports a mechanical fall backwards. striking her head., Pt reports head and neck pain. Pt alert x4. NAD at this time.

## 2015-08-09 NOTE — ED Notes (Signed)
THE PT IS LEAVING SHE IS TIRED OF WAITING.  SHE WILL SEE HER DOCTOR TOMORROW

## 2015-08-10 DIAGNOSIS — S138XXA Sprain of joints and ligaments of other parts of neck, initial encounter: Secondary | ICD-10-CM | POA: Diagnosis not present

## 2015-08-12 DIAGNOSIS — G44319 Acute post-traumatic headache, not intractable: Secondary | ICD-10-CM | POA: Diagnosis not present

## 2015-08-17 DIAGNOSIS — M542 Cervicalgia: Secondary | ICD-10-CM | POA: Diagnosis not present

## 2015-08-17 DIAGNOSIS — Z90710 Acquired absence of both cervix and uterus: Secondary | ICD-10-CM | POA: Diagnosis not present

## 2015-08-17 DIAGNOSIS — Z9049 Acquired absence of other specified parts of digestive tract: Secondary | ICD-10-CM | POA: Diagnosis not present

## 2015-08-17 DIAGNOSIS — S161XXA Strain of muscle, fascia and tendon at neck level, initial encounter: Secondary | ICD-10-CM | POA: Diagnosis not present

## 2015-08-17 DIAGNOSIS — Z9889 Other specified postprocedural states: Secondary | ICD-10-CM | POA: Diagnosis not present

## 2015-08-17 DIAGNOSIS — S0990XA Unspecified injury of head, initial encounter: Secondary | ICD-10-CM | POA: Diagnosis not present

## 2015-08-17 DIAGNOSIS — M50322 Other cervical disc degeneration at C5-C6 level: Secondary | ICD-10-CM | POA: Diagnosis not present

## 2015-08-17 DIAGNOSIS — R51 Headache: Secondary | ICD-10-CM | POA: Diagnosis not present

## 2015-08-19 DIAGNOSIS — I6782 Cerebral ischemia: Secondary | ICD-10-CM | POA: Diagnosis not present

## 2015-08-24 DIAGNOSIS — K5900 Constipation, unspecified: Secondary | ICD-10-CM | POA: Diagnosis not present

## 2015-08-24 DIAGNOSIS — R102 Pelvic and perineal pain: Secondary | ICD-10-CM | POA: Diagnosis not present

## 2015-10-11 DIAGNOSIS — N952 Postmenopausal atrophic vaginitis: Secondary | ICD-10-CM | POA: Diagnosis not present

## 2015-10-11 DIAGNOSIS — N8111 Cystocele, midline: Secondary | ICD-10-CM | POA: Diagnosis not present

## 2015-10-15 DIAGNOSIS — R5383 Other fatigue: Secondary | ICD-10-CM | POA: Diagnosis not present

## 2015-10-15 DIAGNOSIS — K760 Fatty (change of) liver, not elsewhere classified: Secondary | ICD-10-CM | POA: Diagnosis not present

## 2015-10-15 DIAGNOSIS — Z0000 Encounter for general adult medical examination without abnormal findings: Secondary | ICD-10-CM | POA: Diagnosis not present

## 2015-10-15 DIAGNOSIS — M8588 Other specified disorders of bone density and structure, other site: Secondary | ICD-10-CM | POA: Diagnosis not present

## 2015-10-15 DIAGNOSIS — K219 Gastro-esophageal reflux disease without esophagitis: Secondary | ICD-10-CM | POA: Diagnosis not present

## 2015-10-15 DIAGNOSIS — E559 Vitamin D deficiency, unspecified: Secondary | ICD-10-CM | POA: Diagnosis not present

## 2015-10-15 DIAGNOSIS — Z1389 Encounter for screening for other disorder: Secondary | ICD-10-CM | POA: Diagnosis not present

## 2015-10-21 ENCOUNTER — Other Ambulatory Visit: Payer: Self-pay | Admitting: Urology

## 2015-11-16 DIAGNOSIS — Z1239 Encounter for other screening for malignant neoplasm of breast: Secondary | ICD-10-CM | POA: Diagnosis not present

## 2015-11-16 DIAGNOSIS — Z1231 Encounter for screening mammogram for malignant neoplasm of breast: Secondary | ICD-10-CM | POA: Diagnosis not present

## 2015-12-08 DIAGNOSIS — R8271 Bacteriuria: Secondary | ICD-10-CM | POA: Diagnosis not present

## 2015-12-08 DIAGNOSIS — N952 Postmenopausal atrophic vaginitis: Secondary | ICD-10-CM | POA: Diagnosis not present

## 2015-12-21 DIAGNOSIS — R1011 Right upper quadrant pain: Secondary | ICD-10-CM | POA: Diagnosis not present

## 2015-12-21 DIAGNOSIS — Z8719 Personal history of other diseases of the digestive system: Secondary | ICD-10-CM | POA: Diagnosis not present

## 2015-12-22 ENCOUNTER — Other Ambulatory Visit: Payer: Self-pay | Admitting: Family Medicine

## 2015-12-22 DIAGNOSIS — R1011 Right upper quadrant pain: Secondary | ICD-10-CM

## 2015-12-23 DIAGNOSIS — R799 Abnormal finding of blood chemistry, unspecified: Secondary | ICD-10-CM | POA: Diagnosis not present

## 2015-12-31 DIAGNOSIS — N329 Bladder disorder, unspecified: Secondary | ICD-10-CM | POA: Diagnosis not present

## 2015-12-31 DIAGNOSIS — N9489 Other specified conditions associated with female genital organs and menstrual cycle: Secondary | ICD-10-CM | POA: Diagnosis not present

## 2016-01-03 ENCOUNTER — Ambulatory Visit
Admission: RE | Admit: 2016-01-03 | Discharge: 2016-01-03 | Disposition: A | Discharge status: Home / Self Care | Payer: Medicare Other | Source: Ambulatory Visit | Attending: Family Medicine | Admitting: Family Medicine

## 2016-01-03 DIAGNOSIS — R1011 Right upper quadrant pain: Secondary | ICD-10-CM

## 2016-01-14 ENCOUNTER — Ambulatory Visit (HOSPITAL_BASED_OUTPATIENT_CLINIC_OR_DEPARTMENT_OTHER): Admission: RE | Admit: 2016-01-14 | Payer: Medicare Other | Source: Ambulatory Visit | Admitting: Urology

## 2016-01-14 ENCOUNTER — Encounter (HOSPITAL_BASED_OUTPATIENT_CLINIC_OR_DEPARTMENT_OTHER): Admission: RE | Payer: Self-pay | Source: Ambulatory Visit

## 2016-01-14 SURGERY — COLPORRHAPHY, ANTERIOR, FOR CYSTOCELE REPAIR
Anesthesia: General

## 2016-02-25 DIAGNOSIS — N399 Disorder of urinary system, unspecified: Secondary | ICD-10-CM | POA: Diagnosis not present

## 2016-02-25 DIAGNOSIS — N993 Prolapse of vaginal vault after hysterectomy: Secondary | ICD-10-CM | POA: Diagnosis not present

## 2016-02-25 DIAGNOSIS — N398 Other specified disorders of urinary system: Secondary | ICD-10-CM | POA: Diagnosis not present

## 2016-02-25 DIAGNOSIS — N952 Postmenopausal atrophic vaginitis: Secondary | ICD-10-CM | POA: Diagnosis not present

## 2016-02-25 DIAGNOSIS — N9489 Other specified conditions associated with female genital organs and menstrual cycle: Secondary | ICD-10-CM | POA: Diagnosis not present

## 2016-05-12 DIAGNOSIS — M545 Low back pain: Secondary | ICD-10-CM | POA: Diagnosis not present

## 2016-05-12 DIAGNOSIS — R51 Headache: Secondary | ICD-10-CM | POA: Diagnosis not present

## 2016-05-19 DIAGNOSIS — R0789 Other chest pain: Secondary | ICD-10-CM | POA: Diagnosis not present

## 2016-05-19 DIAGNOSIS — K219 Gastro-esophageal reflux disease without esophagitis: Secondary | ICD-10-CM | POA: Diagnosis not present

## 2016-05-19 DIAGNOSIS — R079 Chest pain, unspecified: Secondary | ICD-10-CM | POA: Diagnosis not present

## 2016-05-19 DIAGNOSIS — R0602 Shortness of breath: Secondary | ICD-10-CM | POA: Diagnosis not present

## 2016-05-29 ENCOUNTER — Other Ambulatory Visit: Payer: Self-pay | Admitting: Cardiology

## 2016-05-29 ENCOUNTER — Ambulatory Visit (HOSPITAL_COMMUNITY)
Admission: RE | Admit: 2016-05-29 | Discharge: 2016-05-29 | Disposition: A | Discharge status: Home / Self Care | Payer: Medicare Other | Source: Ambulatory Visit | Attending: Surgery | Admitting: Surgery

## 2016-05-29 DIAGNOSIS — R601 Generalized edema: Secondary | ICD-10-CM

## 2016-05-29 DIAGNOSIS — R600 Localized edema: Secondary | ICD-10-CM | POA: Diagnosis not present

## 2016-05-30 ENCOUNTER — Ambulatory Visit (INDEPENDENT_AMBULATORY_CARE_PROVIDER_SITE_OTHER): Payer: Medicare Other | Admitting: Neurology

## 2016-05-30 ENCOUNTER — Encounter: Payer: Self-pay | Admitting: Neurology

## 2016-05-30 VITALS — BP 141/78 | HR 66 | Ht 63.0 in | Wt 169.5 lb

## 2016-05-30 DIAGNOSIS — G441 Vascular headache, not elsewhere classified: Secondary | ICD-10-CM

## 2016-05-30 MED ORDER — TOPIRAMATE 25 MG PO TABS: 50.0000 mg | ORAL_TABLET | Freq: Every day | ORAL | 3 refills | Status: DC

## 2016-05-30 NOTE — Patient Instructions (Signed)
   With the Topamax 25 mg tablet, start at 25 mg at night for 2 weeks, then take 2 tablets (50 mg) at night.   Topamax (topiramate) is a seizure medication that has an FDA approval for seizures and for migraine headache. Potential side effects of this medication include weight loss, cognitive slowing, tingling in the fingers and toes, and carbonated drinks will taste bad. If any significant side effects are noted on this drug, please contact our office.

## 2016-05-30 NOTE — Progress Notes (Signed)
Reason for visit: Headache  Mackenzie Estes is a 76 y.o. female  History of present illness:  Mackenzie Estes is a 76 year old right-handed black female with a history of migraine headaches. The patient was seen in 2015 for left-sided headaches and headaches on the top of the head. She underwent a carotid Doppler study that was unremarkable, MRI evaluation of the brain at that time showed minimal small vessel ischemic changes. A sedimentation rate was normal. The patient was placed on Topamax and she seemed to do well on the medication, she has stopped the medication and has had no real problems with headaches until mid May 2017. The patient apparently fell backwards around that time and struck the back of her head with brief loss of consciousness. The patient underwent MRI of the brain on 08/19/2015 through [facility], this was unremarkable exception of minimal small vessel changes. The patient has been having daily headaches since that time again on the left side the head and including the left neck, with some neck discomfort. The patient has headache pain on the top of the head as if she is wearing a cap. The patient has had some discomfort into the upper left chest and upper arm, she has sought out a cardiology evaluation through Dr. Wynonia Lawman for this and she will be having MR angiogram evaluation in the future. The patient does note photophobia with the headache, but no nausea or vomiting. She returns back to this office for an evaluation.  Past Medical History:  Diagnosis Date  . Chest pain    Myoview 5/11 with no ischemic or infarction  . Chronic constipation   . Dizziness   . GERD (gastroesophageal reflux disease)    with esophagitis followed by Dr. Cristina Gong  . Headache   . Tortuous colon     Past Surgical History:  Procedure Laterality Date  . APPENDECTOMY    . BREAST SURGERY     tumor removal, benign x2  . ESOPHAGEAL DILATION  2008   Dr. Cristina Gong  . TOTAL ABDOMINAL HYSTERECTOMY W/  BILATERAL SALPINGOOPHORECTOMY      Family History  Problem Relation Age of Onset  . Sudden death Father     drowning  . Pulmonary embolism Mother   . Hypertension Sister     x3  . Colon cancer Sister   . Other Brother     mesothelioma  . Stroke Maternal Grandmother     Social history:  reports that she has never smoked. She has never used smokeless tobacco. She reports that she does not drink alcohol or use drugs.  Medications:  Prior to Admission medications   Medication Sig Start Date End Date Taking? Authorizing Provider  Ascorbic Acid (VITAMIN C) 500 MG CHEW Chew 500 mg by mouth daily.    Yes Historical Provider, MD  aspirin EC 81 MG tablet Take 81 mg by mouth every other day.  03/30/11  Yes Larey Dresser, MD  Cholecalciferol (VITAMIN D PO) Take 1 tablet by mouth daily.    Yes Historical Provider, MD  Cyanocobalamin (VITAMIN B-12 PO) Take 1 tablet by mouth daily.    Yes Historical Provider, MD  Magnesium Oxide 500 MG TABS Take 500 mg by mouth daily.   Yes Historical Provider, MD  vitamin E 1000 UNIT capsule Take 1,000 Units by mouth daily.     Yes Historical Provider, MD  topiramate (TOPAMAX) 25 MG tablet Take 2 tablets (50 mg total) by mouth at bedtime. 05/30/16   Kathrynn Ducking, MD  No Known Allergies  ROS:  Out of a complete 14 system review of symptoms, the patient complains only of the following symptoms, and all other reviewed systems are negative.  Ear pain Eye discomfort, blurred vision Leg swelling Restless legs, daytime sleepiness Joint pain, joint swelling, back pain, achy muscles, muscle cramps, neck pain, neck stiffness Memory loss, dizziness, headache   Blood pressure (!) 141/78, pulse 66, height 5\' 3"  (1.6 m), weight 169 lb 8 oz (76.9 kg), SpO2 98 %.  Physical Exam  General: The patient is alert and cooperative at the time of the examination.  Eyes: Pupils are equal, round, and reactive to light. Discs are flat bilaterally.  Neck: The neck  is supple, no carotid bruits are noted.  Respiratory: The respiratory examination is clear.  Cardiovascular: The cardiovascular examination reveals a regular rate and rhythm, no obvious murmurs or rubs are noted.  Neuromuscular: Range of movement of the cervical spine is full.  Skin: Extremities are without significant edema.  Neurologic Exam  Mental status: The patient is alert and oriented x 3 at the time of the examination. The patient has apparent normal recent and remote memory, with an apparently normal attention span and concentration ability.  Cranial nerves: Facial symmetry is present. There is good sensation of the face to pinprick and soft touch bilaterally. The strength of the facial muscles and the muscles to head turning and shoulder shrug are normal bilaterally. Speech is well enunciated, no aphasia or dysarthria is noted. Extraocular movements are full. Visual fields are full. The tongue is midline, and the patient has symmetric elevation of the soft palate. No obvious hearing deficits are noted.  Motor: The motor testing reveals 5 over 5 strength of all 4 extremities. Good symmetric motor tone is noted throughout.  Sensory: Sensory testing is intact to pinprick, soft touch, and vibration sensation on all 4 extremities. No evidence of extinction is noted.  Coordination: Cerebellar testing reveals good finger-nose-finger and heel-to-shin bilaterally.  Gait and station: Gait is normal. Tandem gait is normal. Romberg is negative. No drift is seen.  Reflexes: Deep tendon reflexes are symmetric and normal bilaterally. Toes are downgoing bilaterally.   Assessment/Plan:  1. Chronic daily headache   The patient has a history of migraine headaches, her headaches were doing well until she bumped her head. She is now having headaches that are very similar to what she was seen for nearly 3 years ago. The patient seemed to respond well to Topamax at that time, we will restart this  medication, she will follow-up in 3 months. The patient is having some left-sided neck pain, some of her headaches may represent cervicogenic headache.   Jill Alexanders MD 05/30/2016 4:19 PM  [facility] [address] [place], [place] [address]  Phone [phone] Fax [phone]

## 2016-06-06 DIAGNOSIS — M5441 Lumbago with sciatica, right side: Secondary | ICD-10-CM | POA: Diagnosis not present

## 2016-06-06 DIAGNOSIS — M542 Cervicalgia: Secondary | ICD-10-CM | POA: Diagnosis not present

## 2016-06-06 DIAGNOSIS — M5442 Lumbago with sciatica, left side: Secondary | ICD-10-CM | POA: Diagnosis not present

## 2016-06-14 DIAGNOSIS — M5441 Lumbago with sciatica, right side: Secondary | ICD-10-CM | POA: Diagnosis not present

## 2016-06-14 DIAGNOSIS — M5442 Lumbago with sciatica, left side: Secondary | ICD-10-CM | POA: Diagnosis not present

## 2016-06-29 DIAGNOSIS — M5441 Lumbago with sciatica, right side: Secondary | ICD-10-CM | POA: Diagnosis not present

## 2016-06-29 DIAGNOSIS — M5442 Lumbago with sciatica, left side: Secondary | ICD-10-CM | POA: Diagnosis not present

## 2016-07-17 DIAGNOSIS — R3989 Other symptoms and signs involving the genitourinary system: Secondary | ICD-10-CM | POA: Diagnosis not present

## 2016-07-17 DIAGNOSIS — N952 Postmenopausal atrophic vaginitis: Secondary | ICD-10-CM | POA: Diagnosis not present

## 2016-07-17 DIAGNOSIS — R1030 Lower abdominal pain, unspecified: Secondary | ICD-10-CM | POA: Diagnosis not present

## 2016-07-17 DIAGNOSIS — N993 Prolapse of vaginal vault after hysterectomy: Secondary | ICD-10-CM | POA: Diagnosis not present

## 2016-07-17 DIAGNOSIS — R300 Dysuria: Secondary | ICD-10-CM | POA: Diagnosis not present

## 2016-07-17 DIAGNOSIS — R8290 Unspecified abnormal findings in urine: Secondary | ICD-10-CM | POA: Diagnosis not present

## 2016-07-18 DIAGNOSIS — M5441 Lumbago with sciatica, right side: Secondary | ICD-10-CM | POA: Diagnosis not present

## 2016-07-18 DIAGNOSIS — M5442 Lumbago with sciatica, left side: Secondary | ICD-10-CM | POA: Diagnosis not present

## 2016-07-23 ENCOUNTER — Other Ambulatory Visit: Payer: Self-pay | Admitting: Cardiology

## 2016-07-23 DIAGNOSIS — R0789 Other chest pain: Secondary | ICD-10-CM

## 2016-07-26 DIAGNOSIS — N398 Other specified disorders of urinary system: Secondary | ICD-10-CM | POA: Diagnosis not present

## 2016-07-28 DIAGNOSIS — N993 Prolapse of vaginal vault after hysterectomy: Secondary | ICD-10-CM | POA: Diagnosis not present

## 2016-07-28 DIAGNOSIS — N952 Postmenopausal atrophic vaginitis: Secondary | ICD-10-CM | POA: Diagnosis not present

## 2016-07-28 DIAGNOSIS — N399 Disorder of urinary system, unspecified: Secondary | ICD-10-CM | POA: Diagnosis not present

## 2016-08-08 DIAGNOSIS — R1030 Lower abdominal pain, unspecified: Secondary | ICD-10-CM | POA: Diagnosis not present

## 2016-08-18 DIAGNOSIS — I872 Venous insufficiency (chronic) (peripheral): Secondary | ICD-10-CM | POA: Diagnosis not present

## 2016-08-18 DIAGNOSIS — R600 Localized edema: Secondary | ICD-10-CM | POA: Diagnosis not present

## 2016-08-18 DIAGNOSIS — R079 Chest pain, unspecified: Secondary | ICD-10-CM | POA: Diagnosis not present

## 2016-08-18 DIAGNOSIS — I119 Hypertensive heart disease without heart failure: Secondary | ICD-10-CM | POA: Diagnosis not present

## 2016-08-18 DIAGNOSIS — K219 Gastro-esophageal reflux disease without esophagitis: Secondary | ICD-10-CM | POA: Diagnosis not present

## 2016-08-18 HISTORY — PX: BLADDER AUGMENTATION: SHX1233

## 2016-08-21 DIAGNOSIS — R0609 Other forms of dyspnea: Secondary | ICD-10-CM | POA: Diagnosis not present

## 2016-08-30 ENCOUNTER — Ambulatory Visit (INDEPENDENT_AMBULATORY_CARE_PROVIDER_SITE_OTHER): Payer: Medicare Other | Admitting: Adult Health

## 2016-08-30 ENCOUNTER — Encounter: Payer: Self-pay | Admitting: Adult Health

## 2016-08-30 VITALS — BP 128/74 | HR 72 | Wt 173.4 lb

## 2016-08-30 DIAGNOSIS — G441 Vascular headache, not elsewhere classified: Secondary | ICD-10-CM | POA: Diagnosis not present

## 2016-08-30 NOTE — Progress Notes (Signed)
I have read the note, and I agree with the clinical assessment and plan.  Mariaguadalupe Fialkowski KEITH   

## 2016-08-30 NOTE — Progress Notes (Signed)
PATIENT: Mackenzie Estes DOB: Mar 18, 1941  REASON FOR VISIT: follow up-  headache HISTORY FROM: patient  HISTORY OF PRESENT ILLNESS: Today 05/30/16:  Mackenzie Estes  is a 76 year old female with a history of migraine headaches. She returns today for follow-up. She states that Topamax helps with her headaches. She states that she is no longer having any headaches.She reports that she has stopped Topamax once her headaches resolved. She is not had any additional headaches. She states that she is going to the Vein clinic after MRI showed poor circulation in legs. She reports that she is wearing compression stockings to help with the swelling. She denies any new neurological symptoms. She returns today for an evaluation.  HISTORY 05/30/16: Mackenzie Estes is a 76 year old right-handed black female with a history of migraine headaches. The patient was seen in 2015 for left-sided headaches and headaches on the top of the head. She underwent a carotid Doppler study that was unremarkable, MRI evaluation of the brain at that time showed minimal small vessel ischemic changes. A sedimentation rate was normal. The patient was placed on Topamax and she seemed to do well on the medication, she has stopped the medication and has had no real problems with headaches until mid May 2017. The patient apparently fell backwards around that time and struck the back of her head with brief loss of consciousness. The patient underwent MRI of the brain on 08/19/2015 through [facility], this was unremarkable exception of minimal small vessel changes. The patient has been having daily headaches since that time again on the left side the head and including the left neck, with some neck discomfort. The patient has headache pain on the top of the head as if she is wearing a cap. The patient has had some discomfort into the upper left chest and upper arm, she has sought out a cardiology evaluation through Dr. Wynonia Lawman for this and she will be having MR  angiogram evaluation in the future. The patient does note photophobia with the headache, but no nausea or vomiting. She returns back to this office for an evaluation  REVIEW OF SYSTEMS: Out of a complete 14 system review of symptoms, the patient complains only of the following symptoms, and all other reviewed systems are negative.   Joint pain, joint swelling, back pain, aching muscles, muscle cramps, headache  ALLERGIES: No Known Allergies  HOME MEDICATIONS: Outpatient Medications Prior to Visit  Medication Sig Dispense Refill  . Ascorbic Acid (VITAMIN C) 500 MG CHEW Chew 500 mg by mouth daily.     Marland Kitchen aspirin EC 81 MG tablet Take 81 mg by mouth every other day.  150 tablet 2  . Cholecalciferol (VITAMIN D PO) Take 1 tablet by mouth daily.     . Cyanocobalamin (VITAMIN B-12 PO) Take 1 tablet by mouth daily.     . Magnesium Oxide 500 MG TABS Take 500 mg by mouth daily.    Marland Kitchen topiramate (TOPAMAX) 25 MG tablet Take 2 tablets (50 mg total) by mouth at bedtime. (Patient not taking: Reported on 08/30/2016) 60 tablet 3  . vitamin E 1000 UNIT capsule Take 1,000 Units by mouth daily.       No facility-administered medications prior to visit.     PAST MEDICAL HISTORY: Past Medical History:  Diagnosis Date  . Chest pain    Myoview 5/11 with no ischemic or infarction  . Chronic constipation   . Dizziness   . GERD (gastroesophageal reflux disease)    with esophagitis followed  by Dr. Cristina Gong  . Headache   . Tortuous colon     PAST SURGICAL HISTORY: Past Surgical History:  Procedure Laterality Date  . APPENDECTOMY    . BREAST SURGERY     tumor removal, benign x2  . ESOPHAGEAL DILATION  2008   Dr. Cristina Gong  . TOTAL ABDOMINAL HYSTERECTOMY W/ BILATERAL SALPINGOOPHORECTOMY      FAMILY HISTORY: Family History  Problem Relation Age of Onset  . Sudden death Father        drowning  . Pulmonary embolism Mother   . Hypertension Sister        x3  . Colon cancer Sister   . Other Brother         mesothelioma  . Stroke Maternal Grandmother     SOCIAL HISTORY: Social History   Social History  . Marital status: Widowed    Spouse name: N/A  . Number of children: 0  . Years of education: Master's   Occupational History  . Retired   . Volunteer    Social History Main Topics  . Smoking status: Never Smoker  . Smokeless tobacco: Never Used  . Alcohol use No  . Drug use: No  . Sexual activity: Not on file   Other Topics Concern  . Not on file   Social History Narrative   From [place] originally.    Lives alone, No relationships, goes to church.   Very occasionally coffee , not everyday   Drinks green tea daily      PHYSICAL EXAM  Vitals:   08/30/16 1354  BP: 128/74  Pulse: 72  Weight: 173 lb 6.4 oz (78.7 kg)   Body mass index is 30.72 kg/m.  Generalized: Well developed, in no acute distress   Neurological examination  Mentation: Alert oriented to time, place, history taking. Follows all commands speech and language fluent Cranial nerve II-XII: Pupils were equal round reactive to light. Extraocular movements were full, visual field were full on confrontational test. Facial sensation and strength were normal. Uvula tongue midline. Head turning and shoulder shrug  were normal and symmetric. Motor: The motor testing reveals 5 over 5 strength of all 4 extremities. Good symmetric motor tone is noted throughout.  Sensory: Sensory testing is intact to soft touch on all 4 extremities. No evidence of extinction is noted.  Coordination: Cerebellar testing reveals good finger-nose-finger and heel-to-shin bilaterally.  Gait and station: Gait is normal.  Reflexes: Deep tendon reflexes are symmetric and normal bilaterally.   DIAGNOSTIC DATA (LABS, IMAGING, TESTING) - I reviewed patient records, labs, notes, testing and imaging myself where available.  Lab Results  Component Value Date   WBC 6.0 01/31/2013   HGB 14.8 01/31/2013   HCT 43.6 01/31/2013   MCV 88.4  01/31/2013   PLT 287 01/31/2013      Component Value Date/Time   NA 141 01/31/2013 1319   K 4.1 01/31/2013 1319   CL 104 01/31/2013 1319   CO2 29 01/31/2013 1319   GLUCOSE 84 01/31/2013 1319   BUN 12 01/31/2013 1319   CREATININE 0.94 01/31/2013 1319   CREATININE 1.20 (H) 10/25/2012 1046   CALCIUM 9.0 01/31/2013 1319   PROT 6.9 01/14/2011 1628   ALBUMIN 3.6 01/14/2011 1628   AST 26 01/14/2011 1628   ALT 14 01/14/2011 1628   ALKPHOS 108 01/14/2011 1628   BILITOT 0.2 (L) 01/14/2011 1628   GFRNONAA 60 (L) 01/31/2013 1319   GFRAA 69 (L) 01/31/2013 1319       ASSESSMENT AND  PLAN 76 y.o. year old female  has a past medical history of Chest pain; Chronic constipation; Dizziness; GERD (gastroesophageal reflux disease); Headache; and Tortuous colon. here with:   1. Headache    Overall the patient is doing well. Her headaches have resolved. She is no longer taking Topamax. She is advised that if her symptoms return or she develops new symptoms she should let us know. She will follow-up in 6 months or sooner if needed.  I spent 15 minutes with the patient. 50% of this time was spent discussing her diagnosis.   Ward Givens, MSN, NP-C 08/30/2016, 2:22 PM [facility] [address], [place], [place] [address] [phone]

## 2016-08-30 NOTE — Patient Instructions (Signed)
If headaches return please let us know

## 2016-09-01 DIAGNOSIS — K219 Gastro-esophageal reflux disease without esophagitis: Secondary | ICD-10-CM | POA: Diagnosis not present

## 2016-09-01 DIAGNOSIS — N993 Prolapse of vaginal vault after hysterectomy: Secondary | ICD-10-CM | POA: Diagnosis not present

## 2016-09-01 DIAGNOSIS — N812 Incomplete uterovaginal prolapse: Secondary | ICD-10-CM | POA: Diagnosis not present

## 2016-09-02 DIAGNOSIS — N993 Prolapse of vaginal vault after hysterectomy: Secondary | ICD-10-CM | POA: Diagnosis not present

## 2016-09-02 DIAGNOSIS — K219 Gastro-esophageal reflux disease without esophagitis: Secondary | ICD-10-CM | POA: Diagnosis not present

## 2016-09-12 ENCOUNTER — Telehealth: Payer: Self-pay | Admitting: Vascular Surgery

## 2016-09-12 NOTE — Telephone Encounter (Signed)
-----   Message from Rica Records, RN sent at 09/11/2016  5:10 PM EDT ----- Regarding: scheduling Contact: [phone] Please schedule Mackenzie Estes for new [facility] patient appointment (any  MD).  No venous reflux study needed/ done 05-29-2016 at [facility].  [name] is c/o bilateral leg pain and swelling (L>R) and burning sensation in ankles and feet.

## 2016-09-12 NOTE — Telephone Encounter (Signed)
Sched appt 09/28/16 at 10:00. Spoke to pt to confirm appt.

## 2016-09-18 ENCOUNTER — Encounter: Payer: Self-pay | Admitting: Vascular Surgery

## 2016-09-25 ENCOUNTER — Ambulatory Visit (INDEPENDENT_AMBULATORY_CARE_PROVIDER_SITE_OTHER): Payer: Medicare Other | Admitting: Vascular Surgery

## 2016-09-25 ENCOUNTER — Encounter: Payer: Self-pay | Admitting: Vascular Surgery

## 2016-09-25 VITALS — BP 140/84 | Temp 97.8°F | Resp 18 | Ht 63.5 in | Wt 172.0 lb

## 2016-09-25 DIAGNOSIS — I83893 Varicose veins of bilateral lower extremities with other complications: Secondary | ICD-10-CM

## 2016-09-25 NOTE — Progress Notes (Signed)
Subjective:     Patient ID: Mackenzie Estes, female   DOB: [birth year], 76 y.o.   MRN: [id number]  HPI This 76 year old female is evaluated for bilateral varicose veins with Swelling. Patient has had many broken veins in both legs particular in the thighs for many years. She has been having swelling in both ankles and feet for many years as well. She was recently evaluated by Dr. Tollie Eth who ordered a bilateral venous reflux exam in our office in March 2018 which revealed small sized great saphenous veins bilaterally with a few areas of reflux. She has no history of DVT thrombophlebitis stasis ulcers or bleeding. She does not elastic compression stockings are elevated her legs and regular basis.  Past Medical History:  Diagnosis Date  . Chest pain    Myoview 5/11 with no ischemic or infarction  . Chronic constipation   . Dizziness   . GERD (gastroesophageal reflux disease)    with esophagitis followed by Dr. Cristina Gong  . Headache   . Tortuous colon     Social History  Substance Use Topics  . Smoking status: Never Smoker  . Smokeless tobacco: Never Used  . Alcohol use No    Family History  Problem Relation Age of Onset  . Sudden death Father        drowning  . Pulmonary embolism Mother   . Hypertension Sister        x3  . Colon cancer Sister   . Other Brother        mesothelioma  . Stroke Maternal Grandmother     No Known Allergies   Current Outpatient Prescriptions:  .  Ascorbic Acid (VITAMIN C) 500 MG CHEW, Chew 500 mg by mouth daily. , Disp: , Rfl:  .  aspirin EC 81 MG tablet, Take 81 mg by mouth every other day. , Disp: 150 tablet, Rfl: 2 .  Cholecalciferol (VITAMIN D PO), Take 1 tablet by mouth daily. , Disp: , Rfl:  .  conjugated estrogens (PREMARIN) vaginal cream, Place vaginally., Disp: , Rfl:  .  Cyanocobalamin (VITAMIN B-12 PO), Take 1 tablet by mouth daily. , Disp: , Rfl:  .  Magnesium Oxide 500 MG TABS, Take 500 mg by mouth daily., Disp: , Rfl:  .  vitamin  E 1000 UNIT capsule, Take by mouth., Disp: , Rfl:   Vitals:   09/25/16 1536  BP: 140/84  Resp: 18  Temp: 97.8 F (36.6 C)  TempSrc: Oral  SpO2: 96%  Weight: 172 lb (78 kg)  Height: 5' 3.5" (1.613 m)    Body mass index is 29.99 kg/m.         Review of Systems Has occasional chest discomfort. Had negative cardiac workup recently. No claudication symptoms. See history of present illness    Objective:   Physical Exam BP 140/84 (BP Location: Left Arm, Patient Position: Sitting, Cuff Size: Normal)   Temp 97.8 F (36.6 C) (Oral)   Resp 18   Ht 5' 3.5" (1.613 m)   Wt 172 lb (78 kg)   SpO2 96%   BMI 29.99 kg/m     Gen.-alert and oriented x3 in no apparent distress HEENT normal for age Lungs no rhonchi or wheezing Cardiovascular regular rhythm no murmurs carotid pulses 3+ palpable no bruits audible Abdomen soft nontender no palpable masses Musculoskeletal free of  major deformities Skin clear -no rashes Neurologic normal Lower extremities 3+ femoral and dorsalis pedis pulses palpable bilaterally with 1+ edema in the ankles and dorsum of feet  bilaterally Diffuse spider veins and medial and lateral thighs bilaterally. No bulging varicosities noted.  Today I reviewed the ultrasound which was performed 05/29/2016 in our office which revealed some reflux in bilateral great saphenous veins but very small caliber veins bilaterally with no DVT. There was no deep vein reflux noted  I performed a bedside sono site ultrasound exam today which revealed normal-sized great saphenous veins bilaterally with no obvious reflux       Assessment:     Bilateral lower extremity edema etiology unknown No evidence of superficial reflux in great saphenous veins of significance and no DVT    Plan:     #1 elevate foot of bed 2-3 inches #2 short leg elastic compression stockings 20-30 millimeter gradient to be placed on the legs first thing in the a.m. #3 no further workup of venous  system is indicated in no intervention indicated

## 2016-09-28 ENCOUNTER — Encounter: Payer: Medicare Other | Admitting: Vascular Surgery

## 2016-10-03 ENCOUNTER — Other Ambulatory Visit: Payer: Self-pay | Admitting: Cardiology

## 2016-10-03 DIAGNOSIS — R0609 Other forms of dyspnea: Secondary | ICD-10-CM | POA: Diagnosis not present

## 2016-10-03 DIAGNOSIS — I872 Venous insufficiency (chronic) (peripheral): Secondary | ICD-10-CM | POA: Diagnosis not present

## 2016-10-03 DIAGNOSIS — R0602 Shortness of breath: Secondary | ICD-10-CM | POA: Diagnosis not present

## 2016-10-03 DIAGNOSIS — I119 Hypertensive heart disease without heart failure: Secondary | ICD-10-CM | POA: Diagnosis not present

## 2016-10-03 DIAGNOSIS — R600 Localized edema: Secondary | ICD-10-CM | POA: Diagnosis not present

## 2016-10-03 DIAGNOSIS — K219 Gastro-esophageal reflux disease without esophagitis: Secondary | ICD-10-CM | POA: Diagnosis not present

## 2016-10-03 DIAGNOSIS — R079 Chest pain, unspecified: Secondary | ICD-10-CM | POA: Diagnosis not present

## 2016-10-03 DIAGNOSIS — R072 Precordial pain: Secondary | ICD-10-CM

## 2016-10-10 ENCOUNTER — Encounter: Payer: Self-pay | Admitting: Cardiology

## 2016-10-11 DIAGNOSIS — K5900 Constipation, unspecified: Secondary | ICD-10-CM | POA: Diagnosis not present

## 2016-10-11 DIAGNOSIS — K219 Gastro-esophageal reflux disease without esophagitis: Secondary | ICD-10-CM | POA: Diagnosis not present

## 2016-10-11 DIAGNOSIS — R1012 Left upper quadrant pain: Secondary | ICD-10-CM | POA: Diagnosis not present

## 2016-10-26 DIAGNOSIS — R079 Chest pain, unspecified: Secondary | ICD-10-CM | POA: Diagnosis not present

## 2016-11-02 ENCOUNTER — Ambulatory Visit (HOSPITAL_COMMUNITY)
Admission: RE | Admit: 2016-11-02 | Discharge: 2016-11-02 | Disposition: A | Discharge status: Home / Self Care | Payer: Medicare Other | Source: Ambulatory Visit | Attending: Cardiology | Admitting: Cardiology

## 2016-11-02 DIAGNOSIS — R079 Chest pain, unspecified: Secondary | ICD-10-CM | POA: Diagnosis not present

## 2016-11-02 DIAGNOSIS — R072 Precordial pain: Secondary | ICD-10-CM | POA: Diagnosis not present

## 2016-11-02 LAB — POCT I-STAT CREATININE: CREATININE: 0.4 mg/dL — AB (ref 0.44–1.00)

## 2016-11-02 MED ORDER — NITROGLYCERIN 0.4 MG SL SUBL
SUBLINGUAL_TABLET | SUBLINGUAL | Status: AC
  Filled 2016-11-02: qty 2

## 2016-11-02 MED ORDER — IOPAMIDOL (ISOVUE-370) INJECTION 76%
INTRAVENOUS | Status: AC
  Administered 2016-11-02: 80 mL via INTRAVENOUS
  Filled 2016-11-02: qty 100

## 2016-11-02 MED ORDER — NITROGLYCERIN 0.4 MG SL SUBL
0.8000 mg | SUBLINGUAL_TABLET | Freq: Once | SUBLINGUAL | Status: AC
  Administered 2016-11-02: 0.8 mg via SUBLINGUAL
  Filled 2016-11-02: qty 25

## 2016-11-02 MED ORDER — METOPROLOL TARTRATE 5 MG/5ML IV SOLN
INTRAVENOUS | Status: AC
  Filled 2016-11-02: qty 5

## 2016-11-02 MED ORDER — METOPROLOL TARTRATE 5 MG/5ML IV SOLN
5.0000 mg | Freq: Once | INTRAVENOUS | Status: AC
  Administered 2016-11-02: 5 mg via INTRAVENOUS
  Filled 2016-11-02: qty 5

## 2016-11-09 DIAGNOSIS — E669 Obesity, unspecified: Secondary | ICD-10-CM | POA: Diagnosis not present

## 2016-11-09 DIAGNOSIS — E559 Vitamin D deficiency, unspecified: Secondary | ICD-10-CM | POA: Diagnosis not present

## 2016-11-09 DIAGNOSIS — R600 Localized edema: Secondary | ICD-10-CM | POA: Diagnosis not present

## 2016-11-09 DIAGNOSIS — Z8639 Personal history of other endocrine, nutritional and metabolic disease: Secondary | ICD-10-CM | POA: Diagnosis not present

## 2016-11-09 DIAGNOSIS — K210 Gastro-esophageal reflux disease with esophagitis: Secondary | ICD-10-CM | POA: Diagnosis not present

## 2016-11-09 LAB — LIPID PANEL
CHOLESTEROL: 178 (ref 0–200)
HDL: 63 (ref 35–70)
LDL CALC: 92
Triglycerides: 113 (ref 40–160)

## 2016-12-14 DIAGNOSIS — R55 Syncope and collapse: Secondary | ICD-10-CM | POA: Diagnosis not present

## 2016-12-14 DIAGNOSIS — R51 Headache: Secondary | ICD-10-CM | POA: Diagnosis not present

## 2016-12-14 DIAGNOSIS — H539 Unspecified visual disturbance: Secondary | ICD-10-CM | POA: Diagnosis not present

## 2017-02-07 DIAGNOSIS — Z1231 Encounter for screening mammogram for malignant neoplasm of breast: Secondary | ICD-10-CM | POA: Diagnosis not present

## 2017-02-12 ENCOUNTER — Ambulatory Visit (INDEPENDENT_AMBULATORY_CARE_PROVIDER_SITE_OTHER): Payer: Medicare Other | Admitting: Family Medicine

## 2017-02-12 ENCOUNTER — Encounter: Payer: Self-pay | Admitting: Family Medicine

## 2017-02-12 VITALS — BP 144/74 | HR 69 | Temp 98.4°F | Ht 63.5 in | Wt 173.8 lb

## 2017-02-12 DIAGNOSIS — Z7689 Persons encountering health services in other specified circumstances: Secondary | ICD-10-CM | POA: Diagnosis not present

## 2017-02-12 NOTE — Patient Instructions (Signed)
It was so nice to meet you.  I hope you have a wonderful holiday.

## 2017-02-12 NOTE — Assessment & Plan Note (Signed)
>  25 minutes spent in face to face time with patient, >50% spent in counselling or coordination of care discussing medical history.

## 2017-02-12 NOTE — Progress Notes (Signed)
Subjective:   Patient ID: Mackenzie Estes, female    DOB: March 07, 1941, 76 y.o.   MRN: [id number]  Mackenzie Estes is a pleasant 76 y.o. year old female who presents to clinic today with [place] (Patient is here today to establish care. )  on 02/12/2017  HPI:   Has been followed by Dr. Leighton Ruff. Per pt, just had lab work and wellness visit done two months ago. Awaiting records.  Just had mammogram done on Thursday.  UTD on prevention.  Has not concerns today. Very healthy.  Walks twice daily.  Scheduled for cataract extraction next month.    Current Outpatient Medications on File Prior to Visit  Medication Sig Dispense Refill  . Ascorbic Acid (VITAMIN C) 500 MG CHEW Chew 500 mg by mouth daily.     Marland Kitchen aspirin EC 81 MG tablet Take 81 mg by mouth every other day.  150 tablet 2  . Cholecalciferol (VITAMIN D PO) Take 1 tablet by mouth daily.     . Cyanocobalamin (VITAMIN B-12 PO) Take 1 tablet by mouth daily.     . Magnesium Oxide 500 MG TABS Take 500 mg by mouth daily.    . vitamin E 1000 UNIT capsule Take by mouth.     No current facility-administered medications on file prior to visit.     No Known Allergies  Past Medical History:  Diagnosis Date  . Chest pain    Myoview 5/11 with no ischemic or infarction  . Chronic constipation   . Dizziness   . GERD (gastroesophageal reflux disease)    with esophagitis followed by Dr. Cristina Gong  . Headache   . Tortuous colon     Past Surgical History:  Procedure Laterality Date  . APPENDECTOMY    . BREAST SURGERY     tumor removal, benign x2  . ESOPHAGEAL DILATION  2008   Dr. Cristina Gong  . TOTAL ABDOMINAL HYSTERECTOMY W/ BILATERAL SALPINGOOPHORECTOMY      Family History  Problem Relation Age of Onset  . Sudden death Father        drowning  . Pulmonary embolism Mother   . COPD Mother   . Hypertension Mother   . Hypertension Sister        x3  . Colon cancer Sister   . Cancer Sister   . Other Brother    mesothelioma  . Hypertension Brother   . Stroke Maternal Grandmother   . Drug abuse Daughter   . Early death Daughter   . Arthritis Sister   . Hypertension Sister   . Early death Brother     Social History   Socioeconomic History  . Marital status: Widowed    Spouse name: Not on file  . Number of children: 0  . Years of education: Master's  . Highest education level: Not on file  Social Needs  . Financial resource strain: Not on file  . Food insecurity - worry: Not on file  . Food insecurity - inability: Not on file  . Transportation needs - medical: Not on file  . Transportation needs - non-medical: Not on file  Occupational History  . Occupation: Retired  . Occupation: Psychologist, occupational  Tobacco Use  . Smoking status: Never Smoker  . Smokeless tobacco: Never Used  Substance and Sexual Activity  . Alcohol use: No  . Drug use: No  . Sexual activity: Not on file  Other Topics Concern  . Not on file  Social History Narrative   From [place] originally.  Lives alone, No relationships, goes to church.   Very occasionally coffee , not everyday   Drinks green tea daily   The PMH, PSH, Social History, Family History, Medications, and allergies have been reviewed in [facility], and have been updated if relevant.   Review of Systems  Constitutional: Negative.   HENT: Negative.   Eyes: Negative.   Respiratory: Negative.   Cardiovascular: Negative.   Gastrointestinal: Negative.   Endocrine: Negative.   Genitourinary: Negative.   Musculoskeletal: Negative.   Allergic/Immunologic: Negative.   Neurological: Negative.   Hematological: Negative.   Psychiatric/Behavioral: Negative.   All other systems reviewed and are negative.      Objective:    BP (!) 144/74 (BP Location: Left Arm, Patient Position: Sitting, Cuff Size: Normal)   Pulse 69   Temp 98.4 F (36.9 C) (Oral)   Ht 5' 3.5" (1.613 m)   Wt 173 lb 12.8 oz (78.8 kg)   SpO2 98%   BMI 30.30 kg/m    Physical Exam    Constitutional: She is oriented to person, place, and time. She appears well-developed and well-nourished. No distress.  HENT:  Head: Normocephalic and atraumatic.  Eyes: Conjunctivae are normal.  Neck: Normal range of motion.  Cardiovascular: Normal rate and regular rhythm.  Pulmonary/Chest: Effort normal and breath sounds normal.  Musculoskeletal: Normal range of motion. She exhibits no edema.  Neurological: She is alert and oriented to person, place, and time. No cranial nerve deficit.  Skin: Skin is warm and dry. She is not diaphoretic.  Psychiatric: She has a normal mood and affect. Her behavior is normal. Judgment and thought content normal.  Nursing note and vitals reviewed.         Assessment & Plan:   Encounter to establish care with new doctor No Follow-up on file.

## 2017-02-19 ENCOUNTER — Ambulatory Visit: Payer: Medicare Other | Admitting: Family Medicine

## 2017-02-19 DIAGNOSIS — H2512 Age-related nuclear cataract, left eye: Secondary | ICD-10-CM | POA: Diagnosis not present

## 2017-02-19 DIAGNOSIS — H2513 Age-related nuclear cataract, bilateral: Secondary | ICD-10-CM | POA: Diagnosis not present

## 2017-02-27 ENCOUNTER — Ambulatory Visit: Payer: Medicare Other | Admitting: Family Medicine

## 2017-02-28 ENCOUNTER — Ambulatory Visit: Payer: Medicare Other | Admitting: Family Medicine

## 2017-03-09 ENCOUNTER — Ambulatory Visit: Payer: Medicare Other | Admitting: Family Medicine

## 2017-03-23 NOTE — Progress Notes (Signed)
Subjective:   Mackenzie Estes is a 77 y.o. female who presents for Medicare Annual (Subsequent) preventive examination. Pt is very energetic and pleasant. 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   Review of Systems:  No ROS.  Medicare Wellness Visit. Additional risk factors are reflected in the social history. Cardiac Risk Factors include: advanced age (>38men, >40 women) Sleep patterns: Sleeps well. Feels rested. Home Safety/Smoke Alarms: Feels safe in home. Smoke alarms in place. Lives alone with puppy. 1 story. Walk in shower.   Female:   Pap-Hysterectomy  Mammo- last 10/18/16-normal per pt       Dexa scan- ordered today.       CCS- last 2015- Dr.Buccini. Recall 5 yrs per pt.    Objective:     Vitals: BP 140/90 (BP Location: Left Arm, Patient Position: Sitting, Cuff Size: Normal)   Pulse 65   Ht 5\' 4"  (1.626 m)   Wt 165 lb 6.4 oz (75 kg)   SpO2 98%   BMI 28.39 kg/m   Body mass index is 28.39 kg/m.  Advanced Directives 03/28/2017 09/25/2016 08/09/2015 01/06/2014  Does Patient Have a Medical Advance Directive? Yes Yes No Yes  Type of Paramedic of [place];Living will [place];Living will - Living will  Does patient want to make changes to medical advance directive? No - Patient declined - - -  Copy of [place] in Chart? No - copy requested - - -  Would patient like information on creating a medical advance directive? - - No - patient declined information -    Tobacco Social History   Tobacco Use  Smoking Status Never Smoker  Smokeless Tobacco Never Used     Counseling given: Not Answered   Clinical Intake: Pain : No/denies pain   Past Medical History:  Diagnosis Date  . Cataract   . Chest pain    Myoview 5/11 with no ischemic or infarction  . Chronic constipation   . Dizziness   . GERD (gastroesophageal reflux disease)    with esophagitis followed by Dr. Cristina Gong  . Headache     . Tortuous colon    Past Surgical History:  Procedure Laterality Date  . APPENDECTOMY    . BLADDER AUGMENTATION  08/18/2016  . BREAST SURGERY     tumor removal, benign x2  . ESOPHAGEAL DILATION  2008   Dr. Cristina Gong  . TOTAL ABDOMINAL HYSTERECTOMY W/ BILATERAL SALPINGOOPHORECTOMY     Family History  Problem Relation Age of Onset  . Sudden death Father        drowning  . Pulmonary embolism Mother   . COPD Mother   . Hypertension Mother   . Hypertension Sister        x3  . Colon cancer Sister   . Cancer Sister   . Other Brother        mesothelioma  . Hypertension Brother   . Stroke Maternal Grandmother   . Drug abuse Daughter   . Early death Daughter   . Arthritis Sister   . Hypertension Sister   . Early death Brother    Social History   Socioeconomic History  . Marital status: Widowed    Spouse name: None  . Number of children: 0  . Years of education: Master's  . Highest education level: None  Social Needs  . Financial resource strain: None  . Food insecurity - worry: None  . Food insecurity - inability: None  . Transportation  needs - medical: None  . Transportation needs - non-medical: None  Occupational History  . Occupation: Retired  . Occupation: Psychologist, occupational  Tobacco Use  . Smoking status: Never Smoker  . Smokeless tobacco: Never Used  Substance and Sexual Activity  . Alcohol use: No  . Drug use: No  . Sexual activity: None  Other Topics Concern  . None  Social History Narrative   From [place] originally.    Lives alone, No relationships, goes to church.   Very occasionally coffee , not everyday   Drinks green tea daily    Outpatient Encounter Medications as of 03/28/2017  Medication Sig  . Ascorbic Acid (VITAMIN C) 500 MG CHEW Chew 500 mg by mouth daily.   Marland Kitchen aspirin EC 81 MG tablet Take 81 mg by mouth every other day.   . Cholecalciferol (VITAMIN D PO) Take 1 tablet by mouth daily.   . Cyanocobalamin (VITAMIN B-12 PO) Take 1 tablet by mouth daily.    . Magnesium Oxide 500 MG TABS Take 500 mg by mouth daily.  . vitamin E 1000 UNIT capsule Take by mouth.   No facility-administered encounter medications on file as of 03/28/2017.     Activities of Daily Living In your present state of health, do you have any difficulty performing the following activities: 03/28/2017 02/12/2017  Hearing? N N  Vision? N N  Difficulty concentrating or making decisions? N N  Walking or climbing stairs? N N  Dressing or bathing? N N  Doing errands, shopping? N N  Preparing Food and eating ? N -  Using the Toilet? N -  In the past six months, have you accidently leaked urine? N -  Do you have problems with loss of bowel control? N -  Managing your Medications? N -  Managing your Finances? N -  Housekeeping or managing your Housekeeping? N -  Some recent data might be hidden    Patient Care Team: Lucille Passy, MD as PCP - General (Family Medicine) Rockey Situ (Obstetrics and Gynecology)    Assessment:   This is a routine wellness examination for Mackenzie Estes.Physical assessment deferred to PCP.  Exercise Activities and Dietary recommendations Current Exercise Habits: Home exercise routine, Type of exercise: walking, Time (Minutes): 60, Frequency (Times/Week): 7, Weekly Exercise (Minutes/Week): 420, Intensity: Mild   Diet (meal preparation, eat out, water intake, caffeinated beverages, dairy products, fruits and vegetables): in general, a "healthy" diet  , well balanced   Goals    . Increase physical activity (pt-stated)     States she will begin [employer] next week 3x/ week       Fall Risk Fall Risk  03/28/2017 02/12/2017 08/30/2016  Falls in the past year? Yes No No  Number falls in past yr: 1 - -  Injury with Fall? Yes - -  Comment Back hurts. Appt with Raeford Razor 03/30/17. - -  Follow up Education provided;Falls prevention discussed - -    Depression Screen PHQ 2/9 Scores 03/28/2017 02/12/2017  PHQ - 2 Score 0 0     Cognitive  Function MMSE - Mini Mental State Exam 03/28/2017  Orientation to time 5  Orientation to Place 5  Registration 3  Attention/ Calculation 5  Recall 3  Language- name 2 objects 2  Language- repeat 1  Language- follow 3 step command 3  Language- read & follow direction 1  Write a sentence 1  Copy design 1  Total score 30        Immunization History  Administered Date(s) Administered  . Pneumococcal Conjugate-13 12/19/2011  . Tdap 05/19/2007   Screening Tests Health Maintenance  Topic Date Due  . DEXA SCAN  10/10/2006  . INFLUENZA VACCINE  07/08/2017 (Originally 10/18/2016)  . PNA vac Low Risk Adult (2 of 2 - PPSV23) 02/12/2018 (Originally 12/18/2012)  . TETANUS/TDAP  05/18/2017  . COLONOSCOPY  07/19/2023      Plan:   Follow up with Dr.Schmit as scheduled 03/30/17.  I have ordered your bone density scan. They will call you to schedule appointment.  Continue to eat heart healthy diet (full of fruits, vegetables, whole grains, lean protein, water--limit salt, fat, and sugar intake) and increase physical activity as tolerated.  Continue doing brain stimulating activities (puzzles, reading, adult coloring books, staying active) to keep memory sharp.    I have personally reviewed and noted the following in the patient's chart:   . Medical and social history . Use of alcohol, tobacco or illicit drugs  . Current medications and supplements . Functional ability and status . Nutritional status . Physical activity . Advanced directives . List of other physicians . Hospitalizations, surgeries, and ER visits in previous 12 months . Vitals . Screenings to include cognitive, depression, and falls . Referrals and appointments  In addition, I have reviewed and discussed with patient certain preventive protocols, quality metrics, and best practice recommendations. A written personalized care plan for preventive services as well as general preventive health recommendations were provided  to patient.     Shela Nevin, [place]  03/28/2017

## 2017-03-28 ENCOUNTER — Ambulatory Visit (INDEPENDENT_AMBULATORY_CARE_PROVIDER_SITE_OTHER): Payer: Medicare HMO | Admitting: Behavioral Health

## 2017-03-28 ENCOUNTER — Encounter: Payer: Self-pay | Admitting: Behavioral Health

## 2017-03-28 VITALS — BP 140/90 | HR 65 | Ht 64.0 in | Wt 165.4 lb

## 2017-03-28 DIAGNOSIS — Z0000 Encounter for general adult medical examination without abnormal findings: Secondary | ICD-10-CM

## 2017-03-28 DIAGNOSIS — Z780 Asymptomatic menopausal state: Secondary | ICD-10-CM

## 2017-03-28 NOTE — Patient Instructions (Signed)
Follow up with Dr.Schmit as scheduled 03/30/17.  I have ordered your bone density scan. They will call you to schedule appointment.  Continue to eat heart healthy diet (full of fruits, vegetables, whole grains, lean protein, water--limit salt, fat, and sugar intake) and increase physical activity as tolerated.  Continue doing brain stimulating activities (puzzles, reading, adult coloring books, staying active) to keep memory sharp.    Mackenzie Estes , Thank you for taking time to come for your Medicare Wellness Visit. I appreciate your ongoing commitment to your health goals. Please review the following plan we discussed and let me know if I can assist you in the future.   These are the goals we discussed: Goals    . Increase physical activity (pt-stated)     States she will begin [employer] next week 3x/ week       This is a list of the screening recommended for you and due dates:  Health Maintenance  Topic Date Due  . DEXA scan (bone density measurement)  10/10/2006  . Flu Shot  07/08/2017*  . Pneumonia vaccines (2 of 2 - PPSV23) 02/12/2018*  . Tetanus Vaccine  05/18/2017  . Colon Cancer Screening  07/19/2023  *Topic was postponed. The date shown is not the original due date.    Health Maintenance for Postmenopausal Women Menopause is a normal process in which your reproductive ability comes to an end. This process happens gradually over a span of months to years, usually between the ages of 52 and 59. Menopause is complete when you have missed 12 consecutive menstrual periods. It is important to talk with your health care provider about some of the most common conditions that affect postmenopausal women, such as heart disease, cancer, and bone loss (osteoporosis). Adopting a healthy lifestyle and getting preventive care can help to promote your health and wellness. Those actions can also lower your chances of developing some of these common conditions. What should I know about  menopause? During menopause, you may experience a number of symptoms, such as:  Moderate-to-severe hot flashes.  Night sweats.  Decrease in sex drive.  Mood swings.  Headaches.  Tiredness.  Irritability.  Memory problems.  Insomnia.  Choosing to treat or not to treat menopausal changes is an individual decision that you make with your health care provider. What should I know about hormone replacement therapy and supplements? Hormone therapy products are effective for treating symptoms that are associated with menopause, such as hot flashes and night sweats. Hormone replacement carries certain risks, especially as you become older. If you are thinking about using estrogen or estrogen with progestin treatments, discuss the benefits and risks with your health care provider. What should I know about heart disease and stroke? Heart disease, heart attack, and stroke become more likely as you age. This may be due, in part, to the hormonal changes that your body experiences during menopause. These can affect how your body processes dietary fats, triglycerides, and cholesterol. Heart attack and stroke are both medical emergencies. There are many things that you can do to help prevent heart disease and stroke:  Have your blood pressure checked at least every 1-2 years. High blood pressure causes heart disease and increases the risk of stroke.  If you are 44-65 years old, ask your health care provider if you should take aspirin to prevent a heart attack or a stroke.  Do not use any tobacco products, including cigarettes, chewing tobacco, or electronic cigarettes. If you need help quitting, ask your  health care provider.  It is important to eat a healthy diet and maintain a healthy weight. ? Be sure to include plenty of vegetables, fruits, low-fat dairy products, and lean protein. ? Avoid eating foods that are high in solid fats, added sugars, or salt (sodium).  Get regular exercise. This  is one of the most important things that you can do for your health. ? Try to exercise for at least 150 minutes each week. The type of exercise that you do should increase your heart rate and make you sweat. This is known as moderate-intensity exercise. ? Try to do strengthening exercises at least twice each week. Do these in addition to the moderate-intensity exercise.  Know your numbers.Ask your health care provider to check your cholesterol and your blood glucose. Continue to have your blood tested as directed by your health care provider.  What should I know about cancer screening? There are several types of cancer. Take the following steps to reduce your risk and to catch any cancer development as early as possible. Breast Cancer  Practice breast self-awareness. ? This means understanding how your breasts normally appear and feel. ? It also means doing regular breast self-exams. Let your health care provider know about any changes, no matter how small.  If you are 80 or older, have a clinician do a breast exam (clinical breast exam or CBE) every year. Depending on your age, family history, and medical history, it may be recommended that you also have a yearly breast X-ray (mammogram).  If you have a family history of breast cancer, talk with your health care provider about genetic screening.  If you are at high risk for breast cancer, talk with your health care provider about having an MRI and a mammogram every year.  Breast cancer (BRCA) gene test is recommended for women who have family members with BRCA-related cancers. Results of the assessment will determine the need for genetic counseling and BRCA1 and for BRCA2 testing. BRCA-related cancers include these types: ? Breast. This occurs in males or females. ? Ovarian. ? Tubal. This may also be called fallopian tube cancer. ? Cancer of the abdominal or pelvic lining (peritoneal cancer). ? Prostate. ? Pancreatic.  Cervical,  Uterine, and Ovarian Cancer Your health care provider may recommend that you be screened regularly for cancer of the pelvic organs. These include your ovaries, uterus, and vagina. This screening involves a pelvic exam, which includes checking for microscopic changes to the surface of your cervix (Pap test).  For women ages 21-65, health care providers may recommend a pelvic exam and a Pap test every three years. For women ages 4-65, they may recommend the Pap test and pelvic exam, combined with testing for human papilloma virus (HPV), every five years. Some types of HPV increase your risk of cervical cancer. Testing for HPV may also be done on women of any age who have unclear Pap test results.  Other health care providers may not recommend any screening for nonpregnant women who are considered low risk for pelvic cancer and have no symptoms. Ask your health care provider if a screening pelvic exam is right for you.  If you have had past treatment for cervical cancer or a condition that could lead to cancer, you need Pap tests and screening for cancer for at least 20 years after your treatment. If Pap tests have been discontinued for you, your risk factors (such as having a new sexual partner) need to be reassessed to determine if you  should start having screenings again. Some women have medical problems that increase the chance of getting cervical cancer. In these cases, your health care provider may recommend that you have screening and Pap tests more often.  If you have a family history of uterine cancer or ovarian cancer, talk with your health care provider about genetic screening.  If you have vaginal bleeding after reaching menopause, tell your health care provider.  There are currently no reliable tests available to screen for ovarian cancer.  Lung Cancer Lung cancer screening is recommended for adults 42-13 years old who are at high risk for lung cancer because of a history of smoking. A  yearly low-dose CT scan of the lungs is recommended if you:  Currently smoke.  Have a history of at least 30 pack-years of smoking and you currently smoke or have quit within the past 15 years. A pack-year is smoking an average of one pack of cigarettes per day for one year.  Yearly screening should:  Continue until it has been 15 years since you quit.  Stop if you develop a health problem that would prevent you from having lung cancer treatment.  Colorectal Cancer  This type of cancer can be detected and can often be prevented.  Routine colorectal cancer screening usually begins at age 73 and continues through age 74.  If you have risk factors for colon cancer, your health care provider may recommend that you be screened at an earlier age.  If you have a family history of colorectal cancer, talk with your health care provider about genetic screening.  Your health care provider may also recommend using home test kits to check for hidden blood in your stool.  A small camera at the end of a tube can be used to examine your colon directly (sigmoidoscopy or colonoscopy). This is done to check for the earliest forms of colorectal cancer.  Direct examination of the colon should be repeated every 5-10 years until age 42. However, if early forms of precancerous polyps or small growths are found or if you have a family history or genetic risk for colorectal cancer, you may need to be screened more often.  Skin Cancer  Check your skin from head to toe regularly.  Monitor any moles. Be sure to tell your health care provider: ? About any new moles or changes in moles, especially if there is a change in a mole's shape or color. ? If you have a mole that is larger than the size of a pencil eraser.  If any of your family members has a history of skin cancer, especially at a young age, talk with your health care provider about genetic screening.  Always use sunscreen. Apply sunscreen liberally  and repeatedly throughout the day.  Whenever you are outside, protect yourself by wearing long sleeves, pants, a wide-brimmed hat, and sunglasses.  What should I know about osteoporosis? Osteoporosis is a condition in which bone destruction happens more quickly than new bone creation. After menopause, you may be at an increased risk for osteoporosis. To help prevent osteoporosis or the bone fractures that can happen because of osteoporosis, the following is recommended:  If you are 80-45 years old, get at least 1,000 mg of calcium and at least 600 mg of vitamin D per day.  If you are older than age 85 but younger than age 56, get at least 1,200 mg of calcium and at least 600 mg of vitamin D per day.  If you are  older than age 64, get at least 1,200 mg of calcium and at least 800 mg of vitamin D per day.  Smoking and excessive alcohol intake increase the risk of osteoporosis. Eat foods that are rich in calcium and vitamin D, and do weight-bearing exercises several times each week as directed by your health care provider. What should I know about how menopause affects my mental health? Depression may occur at any age, but it is more common as you become older. Common symptoms of depression include:  Low or sad mood.  Changes in sleep patterns.  Changes in appetite or eating patterns.  Feeling an overall lack of motivation or enjoyment of activities that you previously enjoyed.  Frequent crying spells.  Talk with your health care provider if you think that you are experiencing depression. What should I know about immunizations? It is important that you get and maintain your immunizations. These include:  Tetanus, diphtheria, and pertussis (Tdap) booster vaccine.  Influenza every year before the flu season begins.  Pneumonia vaccine.  Shingles vaccine.  Your health care provider may also recommend other immunizations. This information is not intended to replace advice given to you  by your health care provider. Make sure you discuss any questions you have with your health care provider. Document Released: 04/28/2005 Document Revised: 09/24/2015 Document Reviewed: 12/08/2014 Elsevier Interactive Patient Education  2018 [employer].

## 2017-03-30 ENCOUNTER — Ambulatory Visit: Payer: Medicare HMO | Admitting: Family Medicine

## 2017-03-30 DIAGNOSIS — H04123 Dry eye syndrome of bilateral lacrimal glands: Secondary | ICD-10-CM | POA: Diagnosis not present

## 2017-03-30 DIAGNOSIS — H25812 Combined forms of age-related cataract, left eye: Secondary | ICD-10-CM | POA: Diagnosis not present

## 2017-03-30 DIAGNOSIS — H25811 Combined forms of age-related cataract, right eye: Secondary | ICD-10-CM | POA: Diagnosis not present

## 2017-04-06 NOTE — Progress Notes (Signed)
I reviewed health advisor's note, was available for consultation, and agree with documentation and plan.  

## 2017-04-11 DIAGNOSIS — H2512 Age-related nuclear cataract, left eye: Secondary | ICD-10-CM | POA: Diagnosis not present

## 2017-04-11 DIAGNOSIS — H25812 Combined forms of age-related cataract, left eye: Secondary | ICD-10-CM | POA: Diagnosis not present

## 2017-04-27 DIAGNOSIS — K5900 Constipation, unspecified: Secondary | ICD-10-CM | POA: Diagnosis not present

## 2017-04-27 DIAGNOSIS — R1012 Left upper quadrant pain: Secondary | ICD-10-CM | POA: Diagnosis not present

## 2017-04-27 LAB — BASIC METABOLIC PANEL
BUN: 13 (ref 4–21)
Creatinine: 0.9 (ref 0.5–1.1)
GLUCOSE: 76
POTASSIUM: 4.2 (ref 3.4–5.3)
SODIUM: 141 (ref 137–147)

## 2017-04-27 LAB — HEPATIC FUNCTION PANEL
ALT: 12 (ref 7–35)
AST: 23 (ref 13–35)
Alkaline Phosphatase: 100 (ref 25–125)
Bilirubin, Total: 0.6

## 2017-04-27 LAB — CBC AND DIFFERENTIAL
HCT: 43 (ref 36–46)
HEMOGLOBIN: 14.3 (ref 12.0–16.0)
Platelets: 260 (ref 150–399)
WBC: 5.8

## 2017-04-30 DIAGNOSIS — R600 Localized edema: Secondary | ICD-10-CM | POA: Diagnosis not present

## 2017-04-30 DIAGNOSIS — R0602 Shortness of breath: Secondary | ICD-10-CM | POA: Diagnosis not present

## 2017-04-30 DIAGNOSIS — R079 Chest pain, unspecified: Secondary | ICD-10-CM | POA: Diagnosis not present

## 2017-04-30 DIAGNOSIS — R55 Syncope and collapse: Secondary | ICD-10-CM | POA: Diagnosis not present

## 2017-04-30 DIAGNOSIS — I119 Hypertensive heart disease without heart failure: Secondary | ICD-10-CM | POA: Diagnosis not present

## 2017-04-30 DIAGNOSIS — K219 Gastro-esophageal reflux disease without esophagitis: Secondary | ICD-10-CM | POA: Diagnosis not present

## 2017-04-30 DIAGNOSIS — I872 Venous insufficiency (chronic) (peripheral): Secondary | ICD-10-CM | POA: Diagnosis not present

## 2017-04-30 DIAGNOSIS — R0609 Other forms of dyspnea: Secondary | ICD-10-CM | POA: Diagnosis not present

## 2017-05-04 ENCOUNTER — Ambulatory Visit: Payer: Medicare HMO | Admitting: Family Medicine

## 2017-05-07 DIAGNOSIS — R1012 Left upper quadrant pain: Secondary | ICD-10-CM | POA: Diagnosis not present

## 2017-05-07 DIAGNOSIS — K5900 Constipation, unspecified: Secondary | ICD-10-CM | POA: Diagnosis not present

## 2017-05-11 ENCOUNTER — Encounter: Payer: Self-pay | Admitting: Family Medicine

## 2017-05-11 NOTE — Progress Notes (Signed)
[facility]

## 2017-05-29 DIAGNOSIS — I872 Venous insufficiency (chronic) (peripheral): Secondary | ICD-10-CM | POA: Diagnosis not present

## 2017-05-29 DIAGNOSIS — R079 Chest pain, unspecified: Secondary | ICD-10-CM | POA: Diagnosis not present

## 2017-05-29 DIAGNOSIS — R0609 Other forms of dyspnea: Secondary | ICD-10-CM | POA: Diagnosis not present

## 2017-05-29 DIAGNOSIS — K219 Gastro-esophageal reflux disease without esophagitis: Secondary | ICD-10-CM | POA: Diagnosis not present

## 2017-05-29 DIAGNOSIS — R600 Localized edema: Secondary | ICD-10-CM | POA: Diagnosis not present

## 2017-05-29 DIAGNOSIS — R0602 Shortness of breath: Secondary | ICD-10-CM | POA: Diagnosis not present

## 2017-05-29 DIAGNOSIS — I119 Hypertensive heart disease without heart failure: Secondary | ICD-10-CM | POA: Diagnosis not present

## 2017-07-25 ENCOUNTER — Other Ambulatory Visit: Payer: Self-pay | Admitting: Physician Assistant

## 2017-07-25 DIAGNOSIS — R110 Nausea: Secondary | ICD-10-CM | POA: Diagnosis not present

## 2017-07-25 DIAGNOSIS — R1011 Right upper quadrant pain: Secondary | ICD-10-CM | POA: Diagnosis not present

## 2017-07-25 DIAGNOSIS — K5900 Constipation, unspecified: Secondary | ICD-10-CM | POA: Diagnosis not present

## 2017-07-26 ENCOUNTER — Ambulatory Visit
Admission: RE | Admit: 2017-07-26 | Discharge: 2017-07-26 | Disposition: A | Discharge status: Home / Self Care | Payer: Medicare HMO | Source: Ambulatory Visit | Attending: Physician Assistant | Admitting: Physician Assistant

## 2017-07-26 DIAGNOSIS — R1011 Right upper quadrant pain: Secondary | ICD-10-CM

## 2017-07-26 DIAGNOSIS — R110 Nausea: Secondary | ICD-10-CM

## 2017-08-01 DIAGNOSIS — Z961 Presence of intraocular lens: Secondary | ICD-10-CM | POA: Diagnosis not present

## 2017-08-01 DIAGNOSIS — H2511 Age-related nuclear cataract, right eye: Secondary | ICD-10-CM | POA: Diagnosis not present

## 2017-08-24 DIAGNOSIS — H2511 Age-related nuclear cataract, right eye: Secondary | ICD-10-CM | POA: Diagnosis not present

## 2017-08-27 DIAGNOSIS — Z800 Family history of malignant neoplasm of digestive organs: Secondary | ICD-10-CM | POA: Diagnosis not present

## 2017-08-27 DIAGNOSIS — K5900 Constipation, unspecified: Secondary | ICD-10-CM | POA: Diagnosis not present

## 2017-09-04 ENCOUNTER — Ambulatory Visit (INDEPENDENT_AMBULATORY_CARE_PROVIDER_SITE_OTHER): Payer: Medicare HMO | Admitting: Family Medicine

## 2017-09-04 ENCOUNTER — Encounter: Payer: Self-pay | Admitting: Family Medicine

## 2017-09-04 VITALS — BP 130/82 | HR 67 | Temp 98.8°F | Ht 63.5 in | Wt 172.0 lb

## 2017-09-04 DIAGNOSIS — R2990 Unspecified symptoms and signs involving the nervous system: Secondary | ICD-10-CM | POA: Insufficient documentation

## 2017-09-04 DIAGNOSIS — G4489 Other headache syndrome: Secondary | ICD-10-CM

## 2017-09-04 NOTE — Assessment & Plan Note (Signed)
Very consistent with occipital neuralgia, ongoing chronically and intermittently,  which I did discuss with pt. Also given her a handout (see AVS) to read about it. Given new dizziness/neurological symptoms, I do want her to see neurology. The patient indicates understanding of these issues and agrees with the plan. Orders Placed This Encounter  Procedures  . Ambulatory referral to Neurology

## 2017-09-04 NOTE — Progress Notes (Signed)
Subjective:   Patient ID: Mackenzie Estes, female    DOB: [birth year], 77 y.o.   MRN: [id number]  Mackenzie Estes is a pleasant 77 y.o. year old female who presents to clinic today with Pain in head (Patient is here today C/O an intermittent sharp pain in her head x76yr but has worsened.  It is the top left side of her head and it lasts from a couple seconds which is followed by dizziness and gait imbalance.  Sometimes the pain travels down into her left eye but denies any vision changes. She says that the dizziness is like flashes of dizziness and gets off balance but catches herself. Denies any fine motor skill changes.) and FYI (She had cataract surgery on 6.7.19 on her right eye.  She is on 3 new eye gtt until the end of next week for this.)  on 09/04/2017  HPI: Patient is here today C/O an intermittent sharp pain in her head x76yr but has worsened. It is the top left side of her head and it lasts from a couple seconds which is followed by dizziness and gait imbalance.The dizziness and imbalance didn't start until a couple of months ago.   Sometimes the pain travels down into her left eye but denies any vision changes. She says that the dizziness is like flashes of dizziness and gets off balance but catches herself. Denies any fine motor skill changes.  Sometimes can happen as often as once or twice a day.  Takes nothing for it because it goes away on it's own within seconds.  She had cataract surgery on 6.7.19 on her right eye. She is on 3 new eye gtt until the end of next week for this.  Current Outpatient Medications on File Prior to Visit  Medication Sig Dispense Refill  . Ascorbic Acid (VITAMIN C) 500 MG CHEW Chew 500 mg by mouth daily.     Marland Kitchen aspirin EC 81 MG tablet Take 81 mg by mouth every other day.  150 tablet 2  . BESIVANCE 0.6 % SUSP   1  . Cholecalciferol (VITAMIN D PO) Take 1 tablet by mouth daily.     . Cyanocobalamin (VITAMIN B-12 PO) Take 1 tablet by mouth daily.     . DUREZOL 0.05  % EMUL   1  . Magnesium Oxide 500 MG TABS Take 500 mg by mouth daily.    Marland Kitchen PROLENSA 0.07 % SOLN   1  . vitamin E 1000 UNIT capsule Take by mouth.     No current facility-administered medications on file prior to visit.     No Known Allergies  Past Medical History:  Diagnosis Date  . Cataract   . Chest pain    Myoview 5/11 with no ischemic or infarction  . Chronic constipation   . Dizziness   . GERD (gastroesophageal reflux disease)    with esophagitis followed by Dr. Cristina Gong  . Headache   . Tortuous colon     Past Surgical History:  Procedure Laterality Date  . APPENDECTOMY    . BLADDER AUGMENTATION  08/18/2016  . BREAST SURGERY     tumor removal, benign x2  . ESOPHAGEAL DILATION  2008   Dr. Cristina Gong  . TOTAL ABDOMINAL HYSTERECTOMY W/ BILATERAL SALPINGOOPHORECTOMY      Family History  Problem Relation Age of Onset  . Sudden death Father        drowning  . Pulmonary embolism Mother   . COPD Mother   . Hypertension Mother   .  Hypertension Sister        x3  . Colon cancer Sister   . Cancer Sister   . Other Brother        mesothelioma  . Hypertension Brother   . Stroke Maternal Grandmother   . Drug abuse Daughter   . Early death Daughter   . Arthritis Sister   . Hypertension Sister   . Early death Brother     Social History   Socioeconomic History  . Marital status: Widowed    Spouse name: Not on file  . Number of children: 0  . Years of education: Master's  . Highest education level: Not on file  Occupational History  . Occupation: Retired  . Occupation: Health visitor  . Financial resource strain: Not on file  . Food insecurity:    Worry: Not on file    Inability: Not on file  . Transportation needs:    Medical: Not on file    Non-medical: Not on file  Tobacco Use  . Smoking status: Never Smoker  . Smokeless tobacco: Never Used  Substance and Sexual Activity  . Alcohol use: No  . Drug use: No  . Sexual activity: Not on file    Lifestyle  . Physical activity:    Days per week: Not on file    Minutes per session: Not on file  . Stress: Not on file  Relationships  . Social connections:    Talks on phone: Not on file    Gets together: Not on file    Attends religious service: Not on file    Active member of club or organization: Not on file    Attends meetings of clubs or organizations: Not on file    Relationship status: Not on file  . Intimate partner violence:    Fear of current or ex partner: Not on file    Emotionally abused: Not on file    Physically abused: Not on file    Forced sexual activity: Not on file  Other Topics Concern  . Not on file  Social History Narrative   From [place] originally.    Lives alone, No relationships, goes to church.   Very occasionally coffee , not everyday   Drinks green tea daily   The PMH, PSH, Social History, Family History, Medications, and allergies have been [facility], and have been updated if relevant.   Review of Systems  Eyes: Positive for pain. Negative for photophobia, discharge, redness, itching and visual disturbance.  Neurological: Positive for dizziness, light-headedness and headaches. Negative for tremors, seizures, syncope, facial asymmetry, speech difficulty, weakness and numbness.  All other systems reviewed and are negative.      Objective:    BP 130/82 (BP Location: Left Arm, Patient Position: Sitting, Cuff Size: Normal)   Pulse 67   Temp 98.8 F (37.1 C) (Oral)   Ht 5' 3.5" (1.613 m)   Wt 172 lb (78 kg)   SpO2 94%   BMI 29.99 kg/m    Physical Exam  Constitutional: She is oriented to person, place, and time. She appears well-developed and well-nourished. No distress.  HENT:  Head: Normocephalic and atraumatic.  Eyes: EOM are normal.  Neck: Normal range of motion.  Cardiovascular: Normal rate.  Pulmonary/Chest: Effort normal.  Musculoskeletal: Normal range of motion. She exhibits no edema.  Neurological: She is alert and  oriented to person, place, and time. She displays normal reflexes. No cranial nerve deficit or sensory deficit. She exhibits normal  muscle tone. Coordination normal.  Skin: Skin is warm and dry. She is not diaphoretic.  Psychiatric: She has a normal mood and affect. Her behavior is normal. Judgment and thought content normal.  Nursing note and vitals reviewed.         Assessment & Plan:   Other headache syndrome  Multiple neurological symptoms No follow-ups on file.

## 2017-09-04 NOTE — Patient Instructions (Signed)
Occipital Neuralgia Occipital neuralgia is a type of headache that causes episodes of very bad pain in the back of your head. Pain from occipital neuralgia may spread (radiate) to other parts of your head. The pain is usually brief and often goes away after you rest and relax. These headaches may be caused by irritation of the nerves that leave your spinal cord high up in your neck, just below the base of your skull (occipital nerves). Your occipital nerves transmit sensations from the back of your head, the top of your head, and the areas behind your ears. What are the causes? Occipital neuralgia can occur without any known cause (primary headache syndrome). In other cases, occipital neuralgia is caused by pressure on or irritation of one of the two occipital nerves. Causes of occipital nerve compression or irritation include:  Wear and tear of the vertebrae in the neck (osteoarthritis).  Neck injury.  Disease of the disks that separate the vertebrae.  Tumors.  Gout.  Infections.  Diabetes.  Swollen blood vessels that put pressure on the occipital nerves.  Muscle spasm in the neck.  What are the signs or symptoms? Pain is the main symptom of occipital neuralgia. It usually starts in the back of the head but may also be felt in other areas supplied by the occipital nerves. Pain is usually on one side but may be on both sides. You may have:  Brief episodes of very bad pain that is burning, stabbing, shocking, or shooting.  Pain behind the eye.  Pain triggered by neck movement or hair brushing.  Scalp tenderness.  Aching in the back of the head between episodes of very bad pain.  How is this diagnosed? Your health care provider may diagnose occipital neuralgia based on your symptoms and a physical exam. During the exam, the health care provider may push on areas supplied by the occipital nerves to see if they are painful. Some tests may also be done to help in making the  diagnosis. These may include:  Imaging studies of the upper spinal cord, such as an MRI or CT scan. These may show compression or spinal cord abnormalities.  Nerve block. You will get an injection of numbing medicine (local anesthetic) near the occipital nerve to see if this relieves pain.  How is this treated? Treatment may begin with simple measures, such as:  Rest.  Massage.  Heat.  Over-the-counter pain relievers.  If these measures do not work, you may need other treatments, including:  Medicines such as: ? Prescription-strength anti-inflammatory medicines. ? Muscle relaxants. ? Antiseizure medicines. ? Antidepressants.  Steroid injection. This involves injections of local anesthetic and strong anti-inflammatory drugs (steroids).  Pulsed radiofrequency. Wires are implanted to deliver electrical impulses that block pain signals from the occipital nerve.  Physical therapy.  Surgery to relieve nerve pressure.  Follow these instructions at home:  Take all medicines as directed by your health care provider.  Avoid activities that cause pain.  Rest when you have an attack of pain.  Try gentle massage or a heating pad to relieve pain.  Work with a physical therapist to learn stretching exercises you can do at home.  Try a different pillow or sleeping position.  Practice good posture.  Try to stay active. Get regular exercise that does not cause pain. Ask your health care provider to suggest safe exercises for you.  Keep all follow-up visits as directed by your health care provider. This is important. Contact a health care provider if:    Your medicine is not working.  You have new or worsening symptoms. Get help right away if:  You have very bad head pain that is not going away.  You have a sudden change in vision, balance, or speech. This information is not intended to replace advice given to you by your health care provider. Make sure you discuss any  questions you have with your health care provider. Document Released: 02/28/2001 Document Revised: 08/12/2015 Document Reviewed: 02/26/2013 Elsevier Interactive Patient Education  2017 [employer] Inc[employer]  

## 2017-09-06 ENCOUNTER — Encounter: Payer: Self-pay | Admitting: Neurology

## 2017-09-17 DIAGNOSIS — Z09 Encounter for follow-up examination after completed treatment for conditions other than malignant neoplasm: Secondary | ICD-10-CM | POA: Diagnosis not present

## 2017-09-25 ENCOUNTER — Ambulatory Visit (INDEPENDENT_AMBULATORY_CARE_PROVIDER_SITE_OTHER): Payer: Medicare HMO | Admitting: Family Medicine

## 2017-09-25 ENCOUNTER — Encounter: Payer: Self-pay | Admitting: Family Medicine

## 2017-09-25 VITALS — BP 128/70 | HR 65 | Temp 97.6°F | Ht 63.5 in | Wt 173.0 lb

## 2017-09-25 DIAGNOSIS — M5442 Lumbago with sciatica, left side: Secondary | ICD-10-CM | POA: Diagnosis not present

## 2017-09-25 NOTE — Assessment & Plan Note (Signed)
It appears that she has spasm in the lower back and also has a component of radicular symptoms in the foot.  She does not have anything specifically that extends down the posterior aspect or lateral aspect of her left leg.  No deficits on exam. -Counseled on home exercise therapy -Provided samples of Vimovo -If no improvement will consider imaging, trigger point injections versus physical therapy.

## 2017-09-25 NOTE — Patient Instructions (Signed)
Nice to meet you  Please try the exercises if your pain is less than a 2/10  Please try heat on the area for 20 minutes at a time  Please try a muscle rub on the area.  Please follow up with me in 3-4 weeks if your pain hasn't improved.

## 2017-09-25 NOTE — Progress Notes (Signed)
[place] - 77 y.o. female MRN [id number]  Date of birth: Dec 13, 1940  SUBJECTIVE:  Including CC & ROS.  Chief Complaint  Patient presents with  . Back Pain    ongoing, old injury, flares up from time to time    Mackenzie Estes is a 77 y.o. female that is here today for back pain.  The pain is located in the left lower back.  This pain is acute on chronic in nature.  She reports having an injury at work in 2009.  Since that time she intermittently has this ongoing back pain.  She is retired currently.  She does walk with her dog on a regular occasion.  She does not take any medicines regularly.  [facility].  She does not have any radicular symptoms but does experience pain in the left foot from time to time.  She is not currently taking any medications.  She has not tried any physical therapy recently.  She did do physical therapy when she got her in 2009.  She denies any inciting event.  She denies any prior surgery to her back.  She denies any numbness or tingling.  She denies any weakness.  Independent review of the lumbar spine x-ray from 2014 shows mild degenerative changes of the lumbar spine.   Review of Systems  Constitutional: Negative for fever.  HENT: Negative for congestion.   Respiratory: Negative for cough.   Cardiovascular: Negative for chest pain.  Gastrointestinal: Negative for abdominal pain.  Musculoskeletal: Positive for back pain.  Skin: Negative for color change.  Neurological: Negative for weakness.  Hematological: Negative for adenopathy.  Psychiatric/Behavioral: Negative for agitation.    HISTORY: Past Medical, Surgical, Social, and Family History Reviewed & Updated per EMR.   Pertinent Historical Findings include:  Past Medical History:  Diagnosis Date  . Cataract   . Chest pain    Myoview 5/11 with no ischemic or infarction  . Chronic constipation   . Dizziness   . GERD (gastroesophageal reflux disease)     with esophagitis followed by Dr. Cristina Gong  . Headache   . Tortuous colon     Past Surgical History:  Procedure Laterality Date  . APPENDECTOMY    . BLADDER AUGMENTATION  08/18/2016  . BREAST SURGERY     tumor removal, benign x2  . ESOPHAGEAL DILATION  2008   Dr. Cristina Gong  . TOTAL ABDOMINAL HYSTERECTOMY W/ BILATERAL SALPINGOOPHORECTOMY      No Known Allergies  Family History  Problem Relation Age of Onset  . Sudden death Father        drowning  . Pulmonary embolism Mother   . COPD Mother   . Hypertension Mother   . Hypertension Sister        x3  . Colon cancer Sister   . Cancer Sister   . Other Brother        mesothelioma  . Hypertension Brother   . Stroke Maternal Grandmother   . Drug abuse Daughter   . Early death Daughter   . Arthritis Sister   . Hypertension Sister   . Early death Brother      Social History   Socioeconomic History  . Marital status: Widowed    Spouse name: Not on file  . Number of children: 0  . Years of education: Master's  . Highest education level: Not on file  Occupational History  . Occupation: Retired  . Occupation: Psychologist, occupational  Social Needs  . Financial resource strain: Not on file  . Food insecurity:    Worry: Not on file    Inability: Not on file  . Transportation needs:    Medical: Not on file    Non-medical: Not on file  Tobacco Use  . Smoking status: Never Smoker  . Smokeless tobacco: Never Used  Substance and Sexual Activity  . Alcohol use: No  . Drug use: No  . Sexual activity: Not on file  Lifestyle  . Physical activity:    Days per week: Not on file    Minutes per session: Not on file  . Stress: Not on file  Relationships  . Social connections:    Talks on phone: Not on file    Gets together: Not on file    Attends religious service: Not on file    Active member of club or organization: Not on file    Attends meetings of clubs or organizations: Not on file    Relationship status: Not on file  .  Intimate partner violence:    Fear of current or ex partner: Not on file    Emotionally abused: Not on file    Physically abused: Not on file    Forced sexual activity: Not on file  Other Topics Concern  . Not on file  Social History Narrative   From [place] originally.    Lives alone, No relationships, goes to church.   Very occasionally coffee , not everyday   Drinks green tea daily     PHYSICAL EXAM:  VS: BP 128/70   Pulse 65   Temp 97.6 F (36.4 C)   Ht 5' 3.5" (1.613 m)   Wt 173 lb (78.5 kg)   SpO2 94%   BMI 30.16 kg/m  Physical Exam Gen: NAD, alert, cooperative with exam, well-appearing ENT: normal lips, normal nasal mucosa,  Eye: normal EOM, normal conjunctiva and lids CV:  no edema, +2 pedal pulses   Resp: no accessory muscle use, non-labored,  Skin: no rashes, no areas of induration  Neuro: normal tone, normal sensation to touch Psych:  normal insight, alert and oriented MSK:  Back/left hip: Tenderness to palpation in the lumbar left lower back. No tenderness to palpation of the midline spine. No tenderness palpation of the greater trochanter piriformis. Normal internal and external rotation of the hip. Normal strength with negative straight leg raise bilaterally. Normal strength resistance with resistance to plantarflexion and dorsiflexion. Normal gait. Neurovascularly intact     ASSESSMENT & PLAN:   Acute left-sided low back pain with left-sided sciatica It appears that she has spasm in the lower back and also has a component of radicular symptoms in the foot.  She does not have anything specifically that extends down the posterior aspect or lateral aspect of her left leg.  No deficits on exam. -Counseled on home exercise therapy -Provided samples of Vimovo -If no improvement will consider imaging, trigger point injections versus physical therapy.

## 2017-10-02 ENCOUNTER — Encounter: Payer: Self-pay | Admitting: Family Medicine

## 2017-10-07 IMAGING — CT CT HEART MORP W/ CTA COR W/ SCORE W/ CA W/CM &/OR W/O CM
4 of 7 series · 8 of 20 positions shown, 9 images · non-contrast
Comparison: None.

CLINICAL DATA: Chest pain

EXAM:
Cardiac CTA
MEDICATIONS:
Sub lingual nitro. 4mg
TECHNIQUE: The patient was scanned on a Siemens [REDACTED]ice scanner. Gantry
rotation speed was 250 msecs. Collimation was .9mm. A 100 kV
prospective scan was triggered in the ascending thoracic aorta at
140 HU's with 5% padding centered around 78% of the R-R interval.
Average HR during the scan was bpm. The 3D data set was interpreted
on a dedicated work station using MPR, MIP and VRT modes. A total of
80cc of contrast was used.

[Series 6: best diast 70 % · axial · 0.31mm/px · z∈[-447,-383]mm · 2 of 482 slices shown, 3 images]
[im 161/482  vessel]
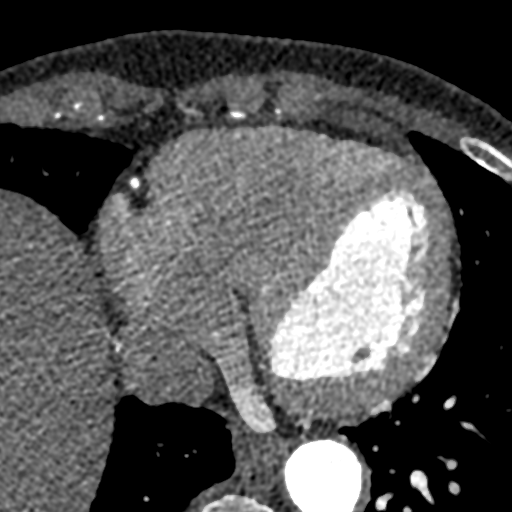
[im 161/482  lung]
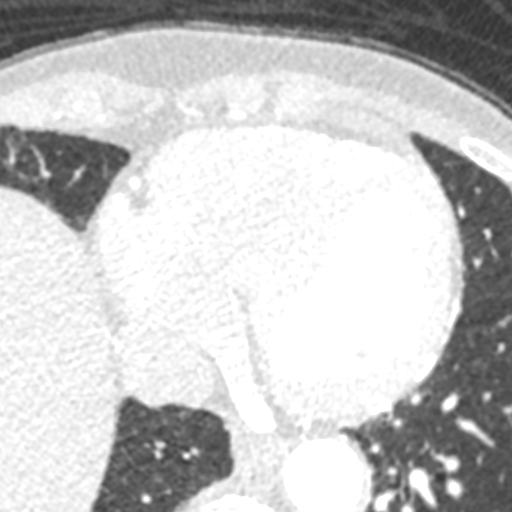
[im 321/482  vessel]
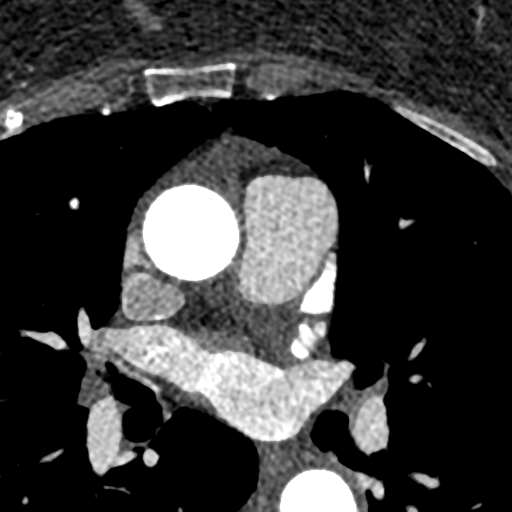

[Series 7: best syst 37 % · axial · 0.31mm/px · z∈[-447,-383]mm · 2 of 482 slices shown]
[im 161/482  vessel]
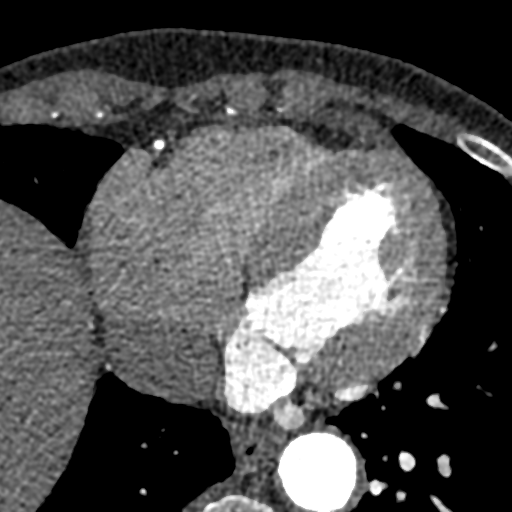
[im 321/482  vessel]
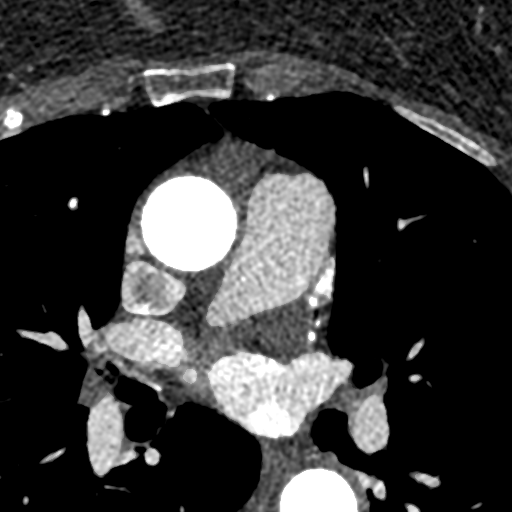

[Series 8: ts diast sharp 70 % · axial · 0.31mm/px · z∈[-447,-383]mm · 2 of 482 slices shown]
[im 161/482  lung]
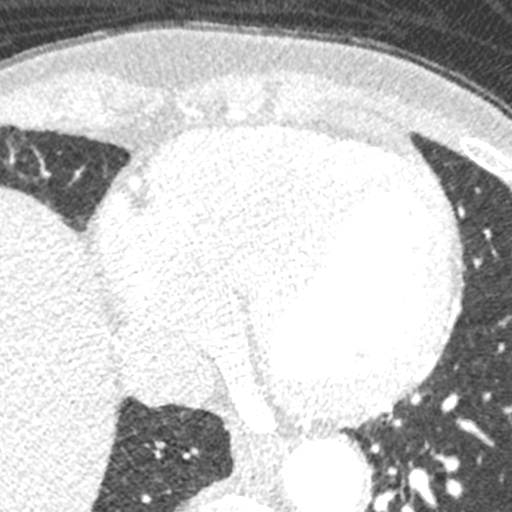
[im 321/482  lung]
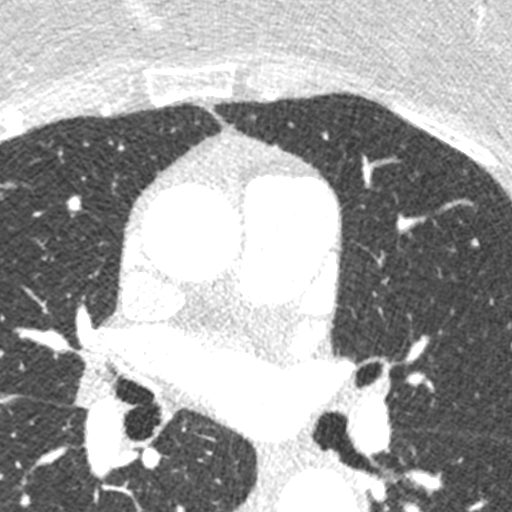

[Series 9: ts syst sharp 37 % · axial · 0.31mm/px · z∈[-447,-383]mm · 2 of 482 slices shown]
[im 161/482  lung]
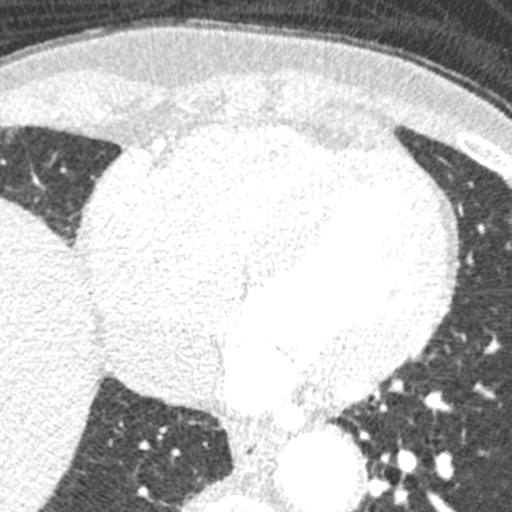
[im 321/482  lung]
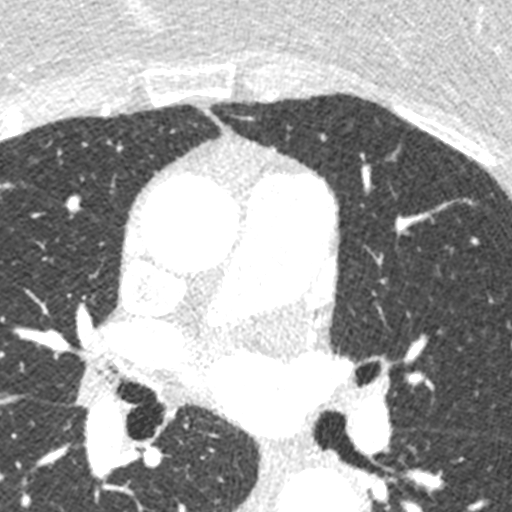

[8 of 20 positions shown; findings below may reference images not displayed]

FINDINGS: Non-cardiac: See separate report from [REDACTED]. No
significant findings on limited lung and soft tissue windows.

Calcium Score: Less than one with isolated punctate area of calcium
in proximal RCA

Coronary Arteries: Right dominant with no anomalies

LM: Normal

LAD:  Normal

D1: Normal

D2: Normal

D3: Normal

Circumflex: Normal

OM1: Normal

OM2: Normal

RCA:  Dominant and normal

PDA: Normal

PLA:  Normal
IMPRESSION: 1) Calcium score less than one

2) Essentially normal right dominant coronary arteries.

Suboptimal study due to patient motion

Beelen Abrahamyan

EXAM:
OVER-READ INTERPRETATION  CT CHEST

The following report is an over-read performed by radiologist Dr.
Zhenya Whitacre [[email or web address]] on 11/02/2016. This over-read
does not include interpretation of cardiac or coronary anatomy or
pathology. The coronary CTA interpretation by the cardiologist is
attached.
FINDINGS: Cardiovascular: Heart is normal size. Aorta is normal caliber.

Mediastinum/Nodes: No mediastinal, hilar, or axillary adenopathy.

Lungs/Pleura: Visualized lungs are clear.  No effusions.

Upper Abdomen: Imaging into the upper abdomen shows no acute
findings.

Musculoskeletal: Chest wall soft tissues are unremarkable. No acute
bony abnormality.
IMPRESSION: No acute or significant extracardiac abnormality.

## 2017-11-08 ENCOUNTER — Ambulatory Visit
Admission: RE | Admit: 2017-11-08 | Discharge: 2017-11-08 | Disposition: A | Discharge status: Home / Self Care | Payer: Medicare HMO | Source: Ambulatory Visit | Attending: Cardiology | Admitting: Cardiology

## 2017-11-08 ENCOUNTER — Other Ambulatory Visit: Payer: Self-pay | Admitting: Cardiology

## 2017-11-08 DIAGNOSIS — R079 Chest pain, unspecified: Secondary | ICD-10-CM

## 2017-11-08 DIAGNOSIS — R600 Localized edema: Secondary | ICD-10-CM | POA: Diagnosis not present

## 2017-11-08 DIAGNOSIS — I872 Venous insufficiency (chronic) (peripheral): Secondary | ICD-10-CM | POA: Diagnosis not present

## 2017-11-08 DIAGNOSIS — I119 Hypertensive heart disease without heart failure: Secondary | ICD-10-CM | POA: Diagnosis not present

## 2017-11-08 DIAGNOSIS — K219 Gastro-esophageal reflux disease without esophagitis: Secondary | ICD-10-CM | POA: Diagnosis not present
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
## 2017-11-13 ENCOUNTER — Encounter

## 2017-11-13 ENCOUNTER — Ambulatory Visit (INDEPENDENT_AMBULATORY_CARE_PROVIDER_SITE_OTHER): Payer: Medicare HMO | Admitting: Neurology

## 2017-11-13 ENCOUNTER — Encounter: Payer: Self-pay | Admitting: Family Medicine

## 2017-11-13 ENCOUNTER — Encounter: Payer: Self-pay | Admitting: Neurology

## 2017-11-13 VITALS — BP 168/84 | HR 67 | Ht 63.0 in | Wt 171.0 lb

## 2017-11-13 DIAGNOSIS — G44229 Chronic tension-type headache, not intractable: Secondary | ICD-10-CM | POA: Diagnosis not present

## 2017-11-13 DIAGNOSIS — R030 Elevated blood-pressure reading, without diagnosis of hypertension: Secondary | ICD-10-CM

## 2017-11-13 DIAGNOSIS — G43109 Migraine with aura, not intractable, without status migrainosus: Secondary | ICD-10-CM | POA: Diagnosis not present

## 2017-11-13 NOTE — Patient Instructions (Signed)
1.  Limit use of Aleve to no more than 2 days out of week to prevent rebound headache 2.  Keep a headache diary 3.  In addition to magnesium, consider taking riboflavin (B2) 400mg  daily and coenzyme Q-10 100mg  three times daily 4.  Follow up in 6 months.

## 2017-11-13 NOTE — Progress Notes (Signed)
Doristine Church MD [facility] Cards/thx dmf

## 2017-12-17 DIAGNOSIS — M5116 Intervertebral disc disorders with radiculopathy, lumbar region: Secondary | ICD-10-CM | POA: Diagnosis not present

## 2017-12-17 DIAGNOSIS — E663 Overweight: Secondary | ICD-10-CM | POA: Diagnosis not present

## 2017-12-17 DIAGNOSIS — N952 Postmenopausal atrophic vaginitis: Secondary | ICD-10-CM | POA: Diagnosis not present

## 2017-12-17 DIAGNOSIS — Z6829 Body mass index (BMI) 29.0-29.9, adult: Secondary | ICD-10-CM | POA: Diagnosis not present

## 2018-02-11 DIAGNOSIS — Z1231 Encounter for screening mammogram for malignant neoplasm of breast: Secondary | ICD-10-CM | POA: Diagnosis not present

## 2018-03-04 DIAGNOSIS — H31103 Choroidal degeneration, unspecified, bilateral: Secondary | ICD-10-CM | POA: Diagnosis not present

## 2018-03-04 DIAGNOSIS — Z961 Presence of intraocular lens: Secondary | ICD-10-CM | POA: Diagnosis not present

## 2018-03-18 ENCOUNTER — Encounter: Payer: Self-pay | Admitting: Family Medicine

## 2018-03-18 ENCOUNTER — Ambulatory Visit (INDEPENDENT_AMBULATORY_CARE_PROVIDER_SITE_OTHER): Payer: Medicare HMO | Admitting: Family Medicine

## 2018-03-18 VITALS — BP 136/78 | HR 69 | Temp 98.5°F | Ht 63.0 in | Wt 169.2 lb

## 2018-03-18 DIAGNOSIS — L729 Follicular cyst of the skin and subcutaneous tissue, unspecified: Secondary | ICD-10-CM

## 2018-03-18 DIAGNOSIS — R2689 Other abnormalities of gait and mobility: Secondary | ICD-10-CM | POA: Diagnosis not present

## 2018-03-18 MED ORDER — AMOXICILLIN 500 MG PO CAPS: 500.0000 mg | ORAL_CAPSULE | Freq: Two times a day (BID) | ORAL | 0 refills | Status: AC

## 2018-03-18 NOTE — Assessment & Plan Note (Signed)
Intermittent- does not currently have these symptoms.  Ears clear, neurological exam normal today, including Babinski and several other cerebellar tests. Advised if symptoms return, will refer to neurology.

## 2018-03-18 NOTE — Progress Notes (Signed)
Subjective:   Patient ID: Mackenzie Estes, female    DOB: Sep 19, 1940, 77 y.o.   MRN: [id number]  Mackenzie Estes is a pleasant 77 y.o. year old female who presents to clinic today with Neck Pain (Patient is here today C/O pain that is in the back right side of her head and radiates down into neck.  Denies any otalgia.  Says that "sometimes I get a little off balance.")  on 03/18/2018  HPI:  Pain on scalp- behind right ear/right base of her skull.  Started shortly after she had her hair done at a salon.  She could tell her head was uncomfortable in the sink used to wash her hair. Since then, very tender spot that at the base of her right skull- pain sometimes radiates up the back of her head or down to her neck.  No ear pain.  She does feel she has been a little off balance since it started.  No other acute neurological symptoms.  She does feel pain is getting better.  Has not taken anything for it.  She has tried changing pillows which did not do much.   Current Outpatient Medications on File Prior to Visit  Medication Sig Dispense Refill  . Ascorbic Acid (VITAMIN C) 500 MG CHEW Chew 500 mg by mouth daily.     Marland Kitchen aspirin EC 81 MG tablet Take 81 mg by mouth every other day.  150 tablet 2  . Cholecalciferol (VITAMIN D PO) Take 1 tablet by mouth daily.     . Cyanocobalamin (VITAMIN B-12 PO) Take 1 tablet by mouth daily.     . Magnesium Oxide 500 MG TABS Take 500 mg by mouth daily.    . vitamin E 1000 UNIT capsule Take by mouth.     No current facility-administered medications on file prior to visit.     No Known Allergies  Past Medical History:  Diagnosis Date  . Cataract   . Chest pain    Myoview 5/11 with no ischemic or infarction  . Chronic constipation   . Dizziness   . GERD (gastroesophageal reflux disease)    with esophagitis followed by Dr. Cristina Gong  . Headache   . Tortuous colon     Past Surgical History:  Procedure Laterality Date  . APPENDECTOMY    . BLADDER  AUGMENTATION  08/18/2016  . BREAST SURGERY     tumor removal, benign x2  . ESOPHAGEAL DILATION  2008   Dr. Cristina Gong  . TOTAL ABDOMINAL HYSTERECTOMY W/ BILATERAL SALPINGOOPHORECTOMY      Family History  Problem Relation Age of Onset  . Sudden death Father 59       drowning  . Pulmonary embolism Mother   . COPD Mother   . Hypertension Mother   . Hypertension Sister        x3  . Colon cancer Sister   . Cancer Sister   . Other Brother        mesothelioma  . Hypertension Brother   . Stroke Maternal Grandmother   . Drug abuse Daughter   . Early death Daughter   . Arthritis Sister   . Hypertension Sister   . Early death Brother        murdered    Social History   Socioeconomic History  . Marital status: Widowed    Spouse name: Not on file  . Number of children: 0  . Years of education: Master's  . Highest education level: Master's degree (e.g., MA, MS, MEng,  MEd, MSW, MBA)  Occupational History  . Occupation: Retired  . Occupation: Health visitor  . Financial resource strain: Not on file  . Food insecurity:    Worry: Not on file    Inability: Not on file  . Transportation needs:    Medical: Not on file    Non-medical: Not on file  Tobacco Use  . Smoking status: Never Smoker  . Smokeless tobacco: Never Used  Substance and Sexual Activity  . Alcohol use: No  . Drug use: No  . Sexual activity: Not on file  Lifestyle  . Physical activity:    Days per week: Not on file    Minutes per session: Not on file  . Stress: Not on file  Relationships  . Social connections:    Talks on phone: Not on file    Gets together: Not on file    Attends religious service: Not on file    Active member of club or organization: Not on file    Attends meetings of clubs or organizations: Not on file    Relationship status: Not on file  . Intimate partner violence:    Fear of current or ex partner: Not on file    Emotionally abused: Not on file    Physically abused: Not on  file    Forced sexual activity: Not on file  Other Topics Concern  . Not on file  Social History Narrative   From [place] originally.    Lives alone, No relationships, goes to church.   Very occasionally coffee , not everyday   Drinks green tea daily      Pt is right-handed. She lives alone in a one story home with small dog. She drinks green tea and turmeric tea. She has an elliptical machine at home   The PMH, [place], Social History, Family History, Medications, and allergies have been reviewed in [facility], and have been updated if relevant.   Review of Systems  Constitutional: Negative.   HENT: Negative.   Eyes: Negative.   Respiratory: Negative.   Cardiovascular: Negative.   Gastrointestinal: Negative.   Endocrine: Negative.   Genitourinary: Negative.   Musculoskeletal: Positive for gait problem.  Allergic/Immunologic: Negative.   Neurological: Negative for dizziness, tremors, seizures, syncope, facial asymmetry, speech difficulty, weakness, light-headedness, numbness and headaches.  Hematological: Negative.   Psychiatric/Behavioral: Negative.   All other systems reviewed and are negative.      Objective:    BP 136/78 (BP Location: Left Arm, Patient Position: Sitting, Cuff Size: Normal)   Pulse 69   Temp 98.5 F (36.9 C) (Oral)   Ht 5\' 3"  (1.6 m)   Wt 169 lb 3.2 oz (76.7 kg)   SpO2 95%   BMI 29.97 kg/m    Physical Exam Vitals signs and nursing note reviewed.  Constitutional:      General: She is not in acute distress.    Appearance: Normal appearance. She is not ill-appearing, toxic-appearing or diaphoretic.  HENT:     Head: Normocephalic and atraumatic.     Jaw: No tenderness.     Salivary Glands: Right salivary gland is not diffusely enlarged or tender. Left salivary gland is not diffusely enlarged or tender.     Comments: TTP over cystic lesion at base of right skull    Right Ear: Tympanic membrane and ear canal normal.     Left Ear: Tympanic membrane and ear canal  normal.     Nose: Nose normal.     Mouth/Throat:  Mouth: Mucous membranes are moist.  Eyes:     Extraocular Movements: Extraocular movements intact.  Neck:     Musculoskeletal: Normal range of motion.  Cardiovascular:     Rate and Rhythm: Normal rate.     Pulses: Normal pulses.  Pulmonary:     Effort: Pulmonary effort is normal.  Musculoskeletal: Normal range of motion.        General: No swelling.  Lymphadenopathy:     Cervical: No cervical adenopathy.  Skin:    General: Skin is warm and dry.  Neurological:     Mental Status: She is alert and oriented to person, place, and time.     Cranial Nerves: Cranial nerves are intact.     Sensory: Sensation is intact.     Motor: Motor function is intact.     Deep Tendon Reflexes: Reflexes are normal and symmetric. Babinski sign absent on the right side. Babinski sign absent on the left side.  Psychiatric:        Mood and Affect: Mood normal.        Behavior: Behavior normal.        Thought Content: Thought content normal.           Assessment & Plan:   Scalp cyst  Balance problem No follow-ups on file.

## 2018-03-18 NOTE — Assessment & Plan Note (Signed)
New- ? Dermoid cyst. She thinks it was irritated at the hair salon which is certainly possibly.  Likely pinching occipital nerve. Will treat with course of amoxicillin. If no improvement, consider [place] and or dermatology referral for removal.  Call or return to clinic prn if these symptoms worsen or fail to improve as anticipated. The patient indicates understanding of these issues and agrees with the plan.

## 2018-03-18 NOTE — Patient Instructions (Signed)
Great to see you.  Take amoxicillin as directed- 500 mg twice daily x 10 days.  Keep me updated.

## 2018-03-22 ENCOUNTER — Telehealth: Payer: Self-pay | Admitting: Family Medicine

## 2018-03-22 DIAGNOSIS — K219 Gastro-esophageal reflux disease without esophagitis: Secondary | ICD-10-CM

## 2018-03-22 NOTE — Telephone Encounter (Signed)
TA-Pt states that [facility] keeps cancelling her appointments/can a referral be made to [facility] and get records sent over? Plz advise/thx dmf

## 2018-03-22 NOTE — Telephone Encounter (Signed)
Copied from [place] [id number]. Topic: Referral - Request for Referral >> Mar 22, 2018  2:08 PM Margot Ables wrote: Has patient seen PCP for this complaint? No - pt states [facility] keeps cancelling her appointments *If NO, is insurance requiring patient see PCP for this issue before PCP can refer them? Referral for which specialty: GI Preferred provider/office: [facility] Reason for referral: acid reflux, [facility] keeps cancelling her appointments and she would like to establish with a different provider

## 2018-03-24 NOTE — Telephone Encounter (Signed)
Referral placed.

## 2018-03-27 NOTE — Telephone Encounter (Signed)
Pt aware/thx dmf 

## 2018-04-04 ENCOUNTER — Telehealth: Payer: Self-pay | Admitting: Gastroenterology

## 2018-04-04 NOTE — Telephone Encounter (Signed)
Hi Dr. Silverio Decamp, we have received a referral from pt's PCP to be evaluated for GERD and esophagitis. Patient was seen last year at [facility] but was not pleased with the care that she received and would like to transfer over. She would like to establish her gi care with you. Could you please review her records and advise if it is ok to schedule patient with you? Thank you.

## 2018-05-14 DIAGNOSIS — K219 Gastro-esophageal reflux disease without esophagitis: Secondary | ICD-10-CM | POA: Diagnosis not present

## 2018-05-14 DIAGNOSIS — R1011 Right upper quadrant pain: Secondary | ICD-10-CM | POA: Diagnosis not present

## 2018-05-14 DIAGNOSIS — M8588 Other specified disorders of bone density and structure, other site: Secondary | ICD-10-CM | POA: Diagnosis not present

## 2018-05-14 DIAGNOSIS — E559 Vitamin D deficiency, unspecified: Secondary | ICD-10-CM | POA: Diagnosis not present

## 2018-05-14 DIAGNOSIS — N952 Postmenopausal atrophic vaginitis: Secondary | ICD-10-CM | POA: Diagnosis not present
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
## 2018-05-15 NOTE — Telephone Encounter (Signed)
Called Mackenzie Estes to schedule consult as Dr. Silverio Decamp accepted [place] Estes. However, Mackenzie Estes stated that she had already seen another GI. Records will be shredded.

## 2018-05-16 ENCOUNTER — Ambulatory Visit: Payer: Medicare HMO | Admitting: Neurology

## 2018-05-21 DIAGNOSIS — Z1389 Encounter for screening for other disorder: Secondary | ICD-10-CM | POA: Diagnosis not present

## 2018-05-21 DIAGNOSIS — Z0000 Encounter for general adult medical examination without abnormal findings: Secondary | ICD-10-CM | POA: Diagnosis not present

## 2018-08-06 ENCOUNTER — Other Ambulatory Visit: Payer: Self-pay | Admitting: Gastroenterology

## 2018-08-06 DIAGNOSIS — R1011 Right upper quadrant pain: Secondary | ICD-10-CM | POA: Diagnosis not present

## 2018-08-07 DIAGNOSIS — R1011 Right upper quadrant pain: Secondary | ICD-10-CM | POA: Diagnosis not present

## 2018-08-15 ENCOUNTER — Encounter (HOSPITAL_COMMUNITY)
Admission: RE | Admit: 2018-08-15 | Discharge: 2018-08-15 | Disposition: A | Discharge status: Home / Self Care | Payer: Medicare HMO | Source: Ambulatory Visit | Attending: Gastroenterology | Admitting: Gastroenterology

## 2018-08-15 ENCOUNTER — Other Ambulatory Visit: Payer: Self-pay

## 2018-08-15 DIAGNOSIS — R110 Nausea: Secondary | ICD-10-CM | POA: Diagnosis not present

## 2018-08-15 DIAGNOSIS — R1011 Right upper quadrant pain: Secondary | ICD-10-CM | POA: Diagnosis present

## 2018-08-15 DIAGNOSIS — R109 Unspecified abdominal pain: Secondary | ICD-10-CM | POA: Diagnosis not present

## 2018-08-15 MED ORDER — TECHNETIUM TC 99M MEBROFENIN IV KIT
5.0000 | PACK | Freq: Once | INTRAVENOUS | Status: AC | PRN
  Administered 2018-08-15: 10:00:00 5 via INTRAVENOUS

## 2018-08-20 DIAGNOSIS — Z800 Family history of malignant neoplasm of digestive organs: Secondary | ICD-10-CM | POA: Diagnosis not present

## 2018-08-20 DIAGNOSIS — R1011 Right upper quadrant pain: Secondary | ICD-10-CM | POA: Diagnosis not present

## 2018-08-20 DIAGNOSIS — R1013 Epigastric pain: Secondary | ICD-10-CM | POA: Diagnosis not present

## 2018-08-29 ENCOUNTER — Other Ambulatory Visit: Payer: Self-pay | Admitting: Gastroenterology

## 2018-08-29 DIAGNOSIS — R1011 Right upper quadrant pain: Secondary | ICD-10-CM

## 2018-08-29 DIAGNOSIS — M25551 Pain in right hip: Secondary | ICD-10-CM | POA: Diagnosis not present

## 2018-08-29 DIAGNOSIS — M545 Low back pain: Secondary | ICD-10-CM | POA: Diagnosis not present

## 2018-09-10 DIAGNOSIS — D122 Benign neoplasm of ascending colon: Secondary | ICD-10-CM | POA: Diagnosis not present

## 2018-09-10 DIAGNOSIS — Z800 Family history of malignant neoplasm of digestive organs: Secondary | ICD-10-CM | POA: Diagnosis not present

## 2018-09-10 DIAGNOSIS — Z1211 Encounter for screening for malignant neoplasm of colon: Secondary | ICD-10-CM | POA: Diagnosis not present

## 2018-09-13 DIAGNOSIS — D122 Benign neoplasm of ascending colon: Secondary | ICD-10-CM | POA: Diagnosis not present

## 2018-11-08 ENCOUNTER — Telehealth: Payer: Self-pay | Admitting: Family Medicine

## 2018-11-08 NOTE — Telephone Encounter (Signed)
I called and left message on patient voicemail to call office and schedule a follow up appointment with Dr. Deborra Medina.

## 2018-11-26 DIAGNOSIS — M542 Cervicalgia: Secondary | ICD-10-CM | POA: Diagnosis not present

## 2018-11-26 DIAGNOSIS — R5383 Other fatigue: Secondary | ICD-10-CM | POA: Diagnosis not present

## 2018-11-26 DIAGNOSIS — K219 Gastro-esophageal reflux disease without esophagitis: Secondary | ICD-10-CM | POA: Diagnosis not present

## 2018-11-26 DIAGNOSIS — M8580 Other specified disorders of bone density and structure, unspecified site: Secondary | ICD-10-CM | POA: Diagnosis not present

## 2018-12-09 DIAGNOSIS — R198 Other specified symptoms and signs involving the digestive system and abdomen: Secondary | ICD-10-CM | POA: Diagnosis not present

## 2018-12-09 DIAGNOSIS — Z800 Family history of malignant neoplasm of digestive organs: Secondary | ICD-10-CM | POA: Diagnosis not present

## 2018-12-09 DIAGNOSIS — K5901 Slow transit constipation: Secondary | ICD-10-CM | POA: Diagnosis not present

## 2018-12-09 DIAGNOSIS — R151 Fecal smearing: Secondary | ICD-10-CM | POA: Diagnosis not present

## 2018-12-24 NOTE — Progress Notes (Deleted)
Subjective:   Mackenzie Estes is a 78 y.o. female who presents for Medicare Annual (Subsequent) preventive examination.  Pt is very pleasant and energetic. Currently still writing Christian devotional book.   Review of Systems:    Home Safety/Smoke Alarms: Feels safe in home. Smoke alarms in place.  Lives alone with dog in 1 story home. Walk in shower.   Female:   Mammo-       Dexa scan-        CCS-     Objective:     Vitals: There were no vitals taken for this visit.  There is no height or weight on file to calculate BMI.  Advanced Directives 03/28/2017 09/25/2016 08/09/2015 01/06/2014  Does Patient Have a Medical Advance Directive? Yes Yes No Yes  Type of Paramedic of [place];Living will [place];Living will - Living will  Does patient want to make changes to medical advance directive? No - Patient declined - - -  Copy of [place] in Chart? No - copy requested - - -  Would patient like information on creating a medical advance directive? - - No - patient declined information -    Tobacco Social History   Tobacco Use  Smoking Status Never Smoker  Smokeless Tobacco Never Used     Counseling given: Not Answered   Clinical Intake:                       Past Medical History:  Diagnosis Date  . Cataract   . Chest pain    Myoview 5/11 with no ischemic or infarction  . Chronic constipation   . Dizziness   . GERD (gastroesophageal reflux disease)    with esophagitis followed by Dr. Cristina Estes  . Headache   . Tortuous colon    Past Surgical History:  Procedure Laterality Date  . APPENDECTOMY    . BLADDER AUGMENTATION  08/18/2016  . BREAST SURGERY     tumor removal, benign x2  . ESOPHAGEAL DILATION  2008   Dr. Cristina Estes  . TOTAL ABDOMINAL HYSTERECTOMY W/ BILATERAL SALPINGOOPHORECTOMY     Family History  Problem Relation Age of Onset  . Sudden death Father 18       drowning  . Pulmonary  embolism Mother   . COPD Mother   . Hypertension Mother   . Hypertension Sister        x3  . Colon cancer Sister   . Cancer Sister   . Other Brother        mesothelioma  . Hypertension Brother   . Stroke Maternal Grandmother   . Drug abuse Daughter   . Early death Daughter   . Arthritis Sister   . Hypertension Sister   . Early death Brother        murdered   Social History   Socioeconomic History  . Marital status: Widowed    Spouse name: Not on file  . Number of children: 0  . Years of education: Master's  . Highest education level: Master's degree (e.g., MA, MS, MEng, MEd, MSW, MBA)  Occupational History  . Occupation: Retired  . Occupation: Health visitor  . Financial resource strain: Not on file  . Food insecurity    Worry: Not on file    Inability: Not on file  . Transportation needs    Medical: Not on file    Non-medical: Not on file  Tobacco Use  . Smoking  status: Never Smoker  . Smokeless tobacco: Never Used  Substance and Sexual Activity  . Alcohol use: No  . Drug use: No  . Sexual activity: Not on file  Lifestyle  . Physical activity    Days per week: Not on file    Minutes per session: Not on file  . Stress: Not on file  Relationships  . Social Herbalist on phone: Not on file    Gets together: Not on file    Attends religious service: Not on file    Active member of club or organization: Not on file    Attends meetings of clubs or organizations: Not on file    Relationship status: Not on file  Other Topics Concern  . Not on file  Social History Narrative   From [place] originally.    Lives alone, No relationships, goes to church.   Very occasionally coffee , not everyday   Drinks green tea daily      Pt is right-handed. She lives alone in a one story home with small dog. She drinks green tea and turmeric tea. She has an elliptical machine at home    Outpatient Encounter Medications as of 12/25/2018  Medication Sig  .  amoxicillin (AMOXIL) 500 MG capsule Take 1 capsule (500 mg total) by mouth 2 (two) times daily.  . Ascorbic Acid (VITAMIN C) 500 MG CHEW Chew 500 mg by mouth daily.   Marland Kitchen aspirin EC 81 MG tablet Take 81 mg by mouth every other day.   . Cholecalciferol (VITAMIN D PO) Take 1 tablet by mouth daily.   . Cyanocobalamin (VITAMIN B-12 PO) Take 1 tablet by mouth daily.   . Magnesium Oxide 500 MG TABS Take 500 mg by mouth daily.  . vitamin E 1000 UNIT capsule Take by mouth.   No facility-administered encounter medications on file as of 12/25/2018.     Activities of Daily Living In your present state of health, do you have any difficulty performing the following activities: 03/18/2018  Hearing? N  Vision? N  Difficulty concentrating or making decisions? N  Walking or climbing stairs? N  Dressing or bathing? N  Doing errands, shopping? N  Some recent data might be hidden    Patient Care Team: Mackenzie Passy, MD as PCP - General (Family Medicine) [place] Estes (Obstetrics and Gynecology)    Assessment:   This is a routine wellness examination for Mackenzie Estes. Physical assessment deferred to PCP.  Exercise Activities and Dietary recommendations   Diet (meal preparation, eat out, water intake, caffeinated beverages, dairy products, fruits and vegetables): {Desc; diets:16563} Breakfast: Lunch:  Dinner:      Goals    . Increase physical activity (pt-stated)     States she will begin [employer] next week 3x/ week       Fall Risk Fall Risk  03/18/2018 11/13/2017 03/28/2017 02/12/2017 08/30/2016  Falls in the past year? 0 Yes Yes No No  Number falls in past yr: - - 1 - -  Injury with Fall? - - Yes - -  Comment - - Back hurts. Appt with Mackenzie Estes 03/30/17. - -  Follow up Falls evaluation completed - Education provided;Falls prevention discussed - -    Depression Screen PHQ 2/9 Scores 03/18/2018 03/28/2017 02/12/2017  PHQ - 2 Score 0 0 0     Cognitive Function Ad8 score reviewed for issues:   Issues making decisions:  Less interest in hobbies / activities:  Repeats questions, stories (family complaining):  Trouble using ordinary gadgets (microwave, computer, phone):  Forgets the month or year:   Mismanaging finances:   Remembering appts:  Daily problems with thinking and/or memory: Ad8 score is=     MMSE - Mini Mental State Exam 03/28/2017  Orientation to time 5  Orientation to Place 5  Registration 3  Attention/ Calculation 5  Recall 3  Language- name 2 objects 2  Language- repeat 1  Language- follow 3 step command 3  Language- read & follow direction 1  Write a sentence 1  Copy design 1  Total score 30        Immunization History  Administered Date(s) Administered  . Pneumococcal Conjugate-13 12/19/2011  . Tdap 05/19/2007   Screening Tests Health Maintenance  Topic Date Due  . DEXA SCAN  10/10/2006  . TETANUS/TDAP  05/18/2017  . MAMMOGRAM  02/12/2019  . INFLUENZA VACCINE  Discontinued  . PNA vac Low Risk Adult  Discontinued     Plan:   ***   I have personally reviewed and noted the following in the patient's chart:   . Medical and social history . Use of alcohol, tobacco or illicit drugs  . Current medications and supplements . Functional ability and status . Nutritional status . Physical activity . Advanced directives . List of other physicians . Hospitalizations, surgeries, and ER visits in previous 12 months . Vitals . Screenings to include cognitive, depression, and falls . Referrals and appointments  In addition, I have reviewed and discussed with patient certain preventive protocols, quality metrics, and best practice recommendations. A written personalized care plan for preventive services as well as general preventive health recommendations were provided to patient.     Shela Nevin, [place]  12/24/2018

## 2018-12-25 ENCOUNTER — Ambulatory Visit: Payer: Medicare HMO | Admitting: *Deleted

## 2019-02-04 DIAGNOSIS — Z780 Asymptomatic menopausal state: Secondary | ICD-10-CM | POA: Diagnosis not present

## 2019-02-04 DIAGNOSIS — M810 Age-related osteoporosis without current pathological fracture: Secondary | ICD-10-CM | POA: Diagnosis not present

## 2019-02-04 DIAGNOSIS — Z90710 Acquired absence of both cervix and uterus: Secondary | ICD-10-CM | POA: Diagnosis not present

## 2019-02-07 DIAGNOSIS — Z23 Encounter for immunization: Secondary | ICD-10-CM | POA: Diagnosis not present

## 2019-02-07 DIAGNOSIS — S61001A Unspecified open wound of right thumb without damage to nail, initial encounter: Secondary | ICD-10-CM | POA: Diagnosis not present

## 2019-02-07 DIAGNOSIS — W540XXA Bitten by dog, initial encounter: Secondary | ICD-10-CM | POA: Diagnosis not present

## 2019-02-10 DIAGNOSIS — R519 Headache, unspecified: Secondary | ICD-10-CM | POA: Diagnosis not present

## 2019-02-10 DIAGNOSIS — J343 Hypertrophy of nasal turbinates: Secondary | ICD-10-CM | POA: Diagnosis not present

## 2019-02-10 DIAGNOSIS — J342 Deviated nasal septum: Secondary | ICD-10-CM | POA: Diagnosis not present

## 2019-02-10 DIAGNOSIS — J309 Allergic rhinitis, unspecified: Secondary | ICD-10-CM | POA: Diagnosis not present

## 2019-02-20 DIAGNOSIS — H5712 Ocular pain, left eye: Secondary | ICD-10-CM | POA: Diagnosis not present

## 2019-02-20 DIAGNOSIS — M26641 Arthritis of right temporomandibular joint: Secondary | ICD-10-CM | POA: Diagnosis not present

## 2019-02-20 DIAGNOSIS — R519 Headache, unspecified: Secondary | ICD-10-CM | POA: Diagnosis not present

## 2019-03-05 DIAGNOSIS — Z1231 Encounter for screening mammogram for malignant neoplasm of breast: Secondary | ICD-10-CM | POA: Diagnosis not present

## 2020-12-14 ENCOUNTER — Other Ambulatory Visit: Payer: Self-pay | Admitting: Gastroenterology

## 2020-12-14 DIAGNOSIS — R1084 Generalized abdominal pain: Secondary | ICD-10-CM

## 2020-12-14 DIAGNOSIS — R634 Abnormal weight loss: Secondary | ICD-10-CM

## 2020-12-29 ENCOUNTER — Ambulatory Visit
Admission: RE | Admit: 2020-12-29 | Discharge: 2020-12-29 | Disposition: A | Discharge status: Home / Self Care | Payer: Medicare HMO | Source: Ambulatory Visit | Attending: Gastroenterology | Admitting: Gastroenterology

## 2020-12-29 DIAGNOSIS — R634 Abnormal weight loss: Secondary | ICD-10-CM

## 2020-12-29 DIAGNOSIS — R1084 Generalized abdominal pain: Secondary | ICD-10-CM

## 2020-12-29 MED ORDER — IOPAMIDOL (ISOVUE-300) INJECTION 61%
100.0000 mL | Freq: Once | INTRAVENOUS | Status: AC | PRN
  Administered 2020-12-29: 100 mL via INTRAVENOUS
